# Patient Record
Sex: Female | Born: 1955 | Race: Black or African American | Hispanic: No | Smoking: Never smoker
Health system: Southern US, Community
[De-identification: ages and names within clinical notes are randomized; demographics above are authoritative.]

## PROBLEM LIST (undated history)

## (undated) DIAGNOSIS — I10 Essential (primary) hypertension: Secondary | ICD-10-CM

## (undated) DIAGNOSIS — E0500 Thyrotoxicosis with diffuse goiter without thyrotoxic crisis or storm: Secondary | ICD-10-CM

## (undated) DIAGNOSIS — H15009 Unspecified scleritis, unspecified eye: Secondary | ICD-10-CM

## (undated) HISTORY — PX: ABDOMINAL HYSTERECTOMY: SHX81

---

## 2020-07-11 ENCOUNTER — Ambulatory Visit (HOSPITAL_COMMUNITY)
Admission: EM | Admit: 2020-07-11 | Discharge: 2020-07-11 | Disposition: A | Discharge status: Home / Self Care | Payer: BC Managed Care – PPO

## 2020-07-11 ENCOUNTER — Other Ambulatory Visit: Payer: Self-pay

## 2020-07-11 ENCOUNTER — Encounter (HOSPITAL_COMMUNITY): Payer: Self-pay

## 2020-07-11 DIAGNOSIS — H579 Unspecified disorder of eye and adnexa: Secondary | ICD-10-CM

## 2020-07-11 DIAGNOSIS — H15002 Unspecified scleritis, left eye: Secondary | ICD-10-CM

## 2020-07-11 HISTORY — DX: Essential (primary) hypertension: I10

## 2020-07-11 HISTORY — DX: Unspecified scleritis, unspecified eye: H15.009

## 2020-07-11 HISTORY — DX: Thyrotoxicosis with diffuse goiter without thyrotoxic crisis or storm: E05.00

## 2020-07-11 MED ORDER — FLUORESCEIN SODIUM 1 MG OP STRP
ORAL_STRIP | OPHTHALMIC | Status: AC
  Filled 2020-07-11: qty 1

## 2020-07-11 MED ORDER — TETRACAINE HCL 0.5 % OP SOLN
OPHTHALMIC | Status: AC
  Filled 2020-07-11: qty 4

## 2020-07-11 MED ORDER — HYDROCODONE-ACETAMINOPHEN 5-325 MG PO TABS
1.0000 | ORAL_TABLET | Freq: Four times a day (QID) | ORAL | 0 refills | Status: DC | PRN
Start: 2020-07-11 — End: 2020-09-26

## 2020-07-11 NOTE — Discharge Instructions (Addendum)
Call ophthalmology tomorrow morning for follow up Hold off on eye drops tonight/tomorrow, continue oral prednisone Hydrocodone for pain in the interim If any symptoms worsening, developing vision changes follow up in the emergency room

## 2020-07-11 NOTE — ED Triage Notes (Signed)
Pt reports feeling pressure and redness  in the left eye for the past 3 days. States she is being treat it for scleritis with oral and eye drops prednisone for the past 3 months. Pt is worry as she was told by the Doctor, she can have high eye pressure as a side effect of the prednisone.

## 2020-07-12 NOTE — ED Provider Notes (Signed)
[facility]    CSN: [id number] Arrival date & time: 07/11/20  1839      History   Chief Complaint Chief Complaint  Patient presents with  . Eye Problem    HPI Donna Chandler is a 64 y.o. female history hypertension, Graves' disease, scleritis, presenting today for evaluation of left eye pressure and headache.  Patient has been treated for scleritis in her left eye over the past 5 months.  She initially was on prednisolone drops along with oral prednisone 40 mg, developed ulcer from drops and were stopped temporarily, ulcer healed and was restarted on prednisolone drops which she has been taking over the past month.  Her oral prednisone has been tapered down to 40 mg.  The appearance of her eyes she feels has improved and has become less erythematous.  Over the past 3 days she has developed increased pains in her eye with a pressure sensation behind her eye which radiates into her left temporal area.  She denies vision changes.  Denies any photophobia.  Denies any clouding or blurring of vision.  Ophthalmologist is in [place], recently moved to [place]. Reports her normal pressure is 13-14.  Denies any drainage, discharge.  Denies itching.  Reports blood pressure has been elevated above normal as well.  HPI  Past Medical History:  Diagnosis Date  . Graves disease   . Hypertension   . Scleritis     There are no problems to display for this patient.   Past Surgical History:  Procedure Laterality Date  . ABDOMINAL HYSTERECTOMY      OB History   No obstetric history on file.      Home Medications    Prior to Admission medications   Medication Sig Start Date End Date Taking? Authorizing Provider  amLODipine (NORVASC) 10 MG tablet amlodipine 10 mg tablet 01/28/15  Yes [provider]  benazepril (LOTENSIN) 10 MG tablet Take by mouth. 05/22/20   [provider]  HYDROcodone-acetaminophen (NORCO/VICODIN) 5-325 MG tablet Take 1-2 tablets by  mouth every 6 (six) hours as needed. 07/11/20   Windsor Zirkelbach C, PA-C  prednisoLONE acetate (PRED FORTE) 1 % ophthalmic suspension Place 1 drop into the left eye 2 (two) times daily. 06/07/20   [provider]  predniSONE (DELTASONE) 20 MG tablet prednisone 20 mg tablet    [provider]  SYNTHROID 112 MCG tablet Take 112 mcg by mouth daily. 05/15/20   [provider]    Family History History reviewed. No pertinent family history.  Social History Social History   Tobacco Use  . Smoking status: Never Smoker  . Smokeless tobacco: Never Used  Substance Use Topics  . Alcohol use: Never  . Drug use: Never     Allergies   Morphine, Cephalexin, and Penicillins   Review of Systems Review of Systems  Constitutional: Negative for activity change, appetite change, chills, fatigue and fever.  HENT: Negative for congestion, ear pain, rhinorrhea, sinus pressure, sore throat and trouble swallowing.   Eyes: Positive for pain. Negative for photophobia, discharge, redness and visual disturbance.  Respiratory: Negative for cough, chest tightness and shortness of breath.   Cardiovascular: Negative for chest pain.  Gastrointestinal: Negative for abdominal pain, diarrhea, nausea and vomiting.  Musculoskeletal: Negative for myalgias.  Skin: Negative for rash.  Neurological: Negative for dizziness, light-headedness and headaches.     Physical Exam Triage Vital Signs ED Triage Vitals  Enc Vitals Group     BP 07/11/20 1924 (!) 163/98  Pulse Rate 07/11/20 1924 85     Resp 07/11/20 1924 18     Temp 07/11/20 1924 98.2 F (36.8 C)     Temp Source 07/11/20 1924 Oral     SpO2 07/11/20 1924 98 %     Weight --      Height --      Head Circumference --      Peak Flow --      Pain Score 07/11/20 2011 7     Pain Loc --      Pain Edu? --      Excl. in GC? --    No data found.  Updated Vital Signs BP (!) 163/98 (BP Location: Right Arm)   Pulse 85   Temp 98.2 F  (36.8 C) (Oral)   Resp 18   SpO2 98%   Visual Acuity Right Eye Distance:   Left Eye Distance:   Bilateral Distance:    Right Eye Near:   Left Eye Near:    Bilateral Near:     Physical Exam Vitals and nursing note reviewed.  Constitutional:      Appearance: She is well-developed.     Comments: No acute distress  HENT:     Head: Normocephalic and atraumatic.     Nose: Nose normal.  Eyes:     Extraocular Movements: Extraocular movements intact.     Conjunctiva/sclera: Conjunctivae normal.     Pupils: Pupils are equal, round, and reactive to light.     Comments: Left eye: Anterior chamber clear; no photophobia with exam, red reflex present, pressure noted to be approximately 20 on the left with Tono-Pen  Cardiovascular:     Rate and Rhythm: Normal rate.  Pulmonary:     Effort: Pulmonary effort is normal. No respiratory distress.  Abdominal:     General: There is no distension.  Musculoskeletal:        General: Normal range of motion.     Cervical back: Neck supple.  Skin:    General: Skin is warm and dry.  Neurological:     Mental Status: She is alert and oriented to person, place, and time.      UC Treatments / Results  Labs (all labs ordered are listed, but only abnormal results are displayed) Labs Reviewed - No data to display  EKG   Radiology No results found.  Procedures Procedures (including critical care time)  Medications Ordered in UC Medications - No data to display  Initial Impression / Assessment and Plan / UC Course  I have reviewed the triage vital signs and the nursing notes.  Pertinent labs & imaging results that were available during my care of the patient were reviewed by me and considered in my medical decision making (see chart for details).     Pressure mildly elevated from normal, but have lower suspicion of acute angle glaucoma, discussed with patient need for further dilated eye exam to further evaluate vasculature and optic disc  MRI, provided contact locally for ophthalmology follow-up.  Recommending symptomatic and supportive care and holding off on prednisolone drops in the interim.  Discussed strict return precautions. Patient verbalized understanding and is agreeable with plan.  Final Clinical Impressions(s) / UC Diagnoses   Final diagnoses:  Eye pressure  Scleritis of left eye     Discharge Instructions     Call ophthalmology tomorrow morning for follow up Hold off on eye drops tonight/tomorrow, continue oral prednisone Hydrocodone for pain in the interim If any symptoms worsening, developing  vision changes follow up in the emergency room    ED Prescriptions    Medication Sig Dispense Auth. Provider   HYDROcodone-acetaminophen (NORCO/VICODIN) 5-325 MG tablet Take 1-2 tablets by mouth every 6 (six) hours as needed. 6 tablet Celine Dishman, [place] C, PA-C     I have reviewed the PDMP during this encounter.   Lew Dawes, PA-C 07/12/20 1211

## 2020-08-08 ENCOUNTER — Encounter (HOSPITAL_COMMUNITY): Payer: Self-pay | Admitting: Emergency Medicine

## 2020-08-08 ENCOUNTER — Ambulatory Visit (HOSPITAL_COMMUNITY)
Admission: EM | Admit: 2020-08-08 | Discharge: 2020-08-08 | Disposition: A | Discharge status: Home / Self Care | Payer: BC Managed Care – PPO | Attending: Family Medicine | Admitting: Family Medicine

## 2020-08-08 ENCOUNTER — Other Ambulatory Visit: Payer: Self-pay

## 2020-08-08 DIAGNOSIS — I10 Essential (primary) hypertension: Secondary | ICD-10-CM | POA: Diagnosis not present

## 2020-08-08 DIAGNOSIS — R519 Headache, unspecified: Secondary | ICD-10-CM

## 2020-08-08 DIAGNOSIS — R112 Nausea with vomiting, unspecified: Secondary | ICD-10-CM

## 2020-08-08 MED ORDER — ONDANSETRON 4 MG PO TBDP
ORAL_TABLET | ORAL | Status: AC
  Filled 2020-08-08: qty 1

## 2020-08-08 MED ORDER — ONDANSETRON 4 MG PO TBDP
4.0000 mg | ORAL_TABLET | Freq: Once | ORAL | Status: AC
  Administered 2020-08-08: 4 mg via ORAL

## 2020-08-08 MED ORDER — ONDANSETRON 4 MG PO TBDP: 4.0000 mg | ORAL_TABLET | Freq: Three times a day (TID) | ORAL | 0 refills | Status: DC | PRN

## 2020-08-08 NOTE — Discharge Instructions (Addendum)
Your blood pressure was noted to be elevated during your visit today. If you are currently taking medication for high blood pressure, please ensure you are taking this as directed. If you do not have a history of high blood pressure and your blood pressure remains persistently elevated, you may need to begin taking a medication at some point. You may return here within the next few days to recheck if unable to see your primary care provider or if do not have a one.  BP (!) 157/95 (BP Location: Left Arm)   Pulse 76   Temp (!) 97.3 F (36.3 C) (Oral)   Resp 20   SpO2 96%  Recheck BP 144/83  [facility] [address] [place], [place] [address] [phone]

## 2020-08-08 NOTE — ED Triage Notes (Addendum)
Pt presents with high blood pressure and nausea, dizziness and headache. States has been taking prednisone for 4 months and her BP continues to run high while taking it. Took prednisone last this am.   Took 30 mg of benazepril and 10 mg of amlodipine around 40 minutes ago to try to bring BP down.   Informed B Hagler, MD of pts condition, okay for pt to wait in lobby to be seen at [facility].

## 2020-08-13 NOTE — ED Provider Notes (Signed)
[facility]   [id number] 08/08/20 Arrival Time: 1440  ASSESSMENT & PLAN:  1. Elevated blood pressure reading in office with diagnosis of hypertension   2. Nausea and vomiting, intractability of vomiting not specified, unspecified vomiting type   3. Nonintractable headache, unspecified chronicity pattern, unspecified headache type     Meds ordered this encounter  Medications  . ondansetron (ZOFRAN-ODT) disintegrating tablet 4 mg  . ondansetron (ZOFRAN-ODT) 4 MG disintegrating tablet    Sig: Take 1 tablet (4 mg total) by mouth every 8 (eight) hours as needed for nausea or vomiting.    Dispense:  15 tablet    Refill:  0   Feeling better after Zofran. Recheck BP 144/83. No indication for ED admission at this time. She is comfortable with home observation. Will schedule prompt PCP f/u.   Discharge Instructions     Your blood pressure was noted to be elevated during your visit today. If you are currently taking medication for high blood pressure, please ensure you are taking this as directed. If you do not have a history of high blood pressure and your blood pressure remains persistently elevated, you may need to begin taking a medication at some point. You may return here within the next few days to recheck if unable to see your primary care provider or if do not have a one.  BP (!) 157/95 (BP Location: Left Arm)   Pulse 76   Temp (!) 97.3 F (36.3 C) (Oral)   Resp 20   SpO2 96%  Recheck BP 144/83  [facility] [address] [place], [place] [address] [phone]       Follow-up Information    [facility] EMERGENCY DEPARTMENT.   Specialty: Emergency Medicine Why: If symptoms worsen in any way. Contact information: [address] [id number] mc [place] [address] [phone]              Reviewed expectations re: course of current medical issues. Questions answered. Outlined signs and symptoms  indicating need for more acute intervention. Patient verbalized understanding. After Visit Summary given.   SUBJECTIVE:  Donna Chandler is a 64 y.o. female who presents with concerns regarding increased blood pressures. She is treated for HTN. She reports taking medications as instructed, no medication side effects noted, no chest pain on exertion, no dyspnea on exertion, no swelling of ankles, no orthopnea or paroxysmal nocturnal dyspnea and no palpitations. Does report intermittent lightheaded feeling and mild headache over the past several days; questions if related to prednisone taper she is to finish today; seems symptoms started when she began prednisone. Took benazepril and amlodipine within the hour. Not the worst headache of her life. Ambulatory without difficulty.   Social History   Tobacco Use  Smoking Status Never Smoker  Smokeless Tobacco Never Used   I.   OBJECTIVE:  Vitals:   08/08/20 1449  BP: (!) 157/95  Pulse: 76  Resp: 20  Temp: (!) 97.3 F (36.3 C)  TempSrc: Oral  SpO2: 96%    General appearance: alert; no distress Eyes: PERRLA; EOMI HENT: normocephalic; atraumatic Neck: supple Lungs: clear to auscultation bilaterally Heart: regular rate and rhythm without murmer Abd: benign Extremities: no edema; symmetrical with no gross deformities Skin: warm and dry Psychological: alert and cooperative; normal mood and affect  Imaging: Allergies  Allergen Reactions  . Morphine     Other reaction(s): Other (See Comments) Other Reaction: OTHER REACTION   . Cephalexin   . Penicillins  Other reaction(s): Unknown    Past Medical History:  Diagnosis Date  . Graves disease   . Hypertension   . Scleritis    Social History   Socioeconomic History  . Marital status: Divorced    Spouse name: Not on file  . Number of children: Not on file  . Years of education: Not on file  . Highest education level: Not on file  Occupational History  . Not on  file  Tobacco Use  . Smoking status: Never Smoker  . Smokeless tobacco: Never Used  Substance and Sexual Activity  . Alcohol use: Never  . Drug use: Never  . Sexual activity: Not on file  Other Topics Concern  . Not on file  Social History Narrative  . Not on file   Social Determinants of Health   Financial Resource Strain:   . Difficulty of Paying Living Expenses: Not on file  Food Insecurity:   . Worried About Programme researcher, broadcasting/film/video in the Last Year: Not on file  . Ran Out of Food in the Last Year: Not on file  Transportation Needs:   . Lack of Transportation (Medical): Not on file  . Lack of Transportation (Non-Medical): Not on file  Physical Activity:   . Days of Exercise per Week: Not on file  . Minutes of Exercise per Session: Not on file  Stress:   . Feeling of Stress : Not on file  Social Connections:   . Frequency of Communication with Friends and Family: Not on file  . Frequency of Social Gatherings with Friends and Family: Not on file  . Attends Religious Services: Not on file  . Active Member of Clubs or Organizations: Not on file  . Attends Banker Meetings: Not on file  . Marital Status: Not on file  Intimate Partner Violence:   . Fear of Current or Ex-Partner: Not on file  . Emotionally Abused: Not on file  . Physically Abused: Not on file  . Sexually Abused: Not on file   History reviewed. No pertinent family history. Past Surgical History:  Procedure Laterality Date  . ABDOMINAL HYSTERECTOMY        Mardella Layman, MD 08/13/20 [phone]

## 2020-09-26 ENCOUNTER — Encounter (HOSPITAL_COMMUNITY): Payer: Self-pay | Admitting: Emergency Medicine

## 2020-09-26 ENCOUNTER — Other Ambulatory Visit: Payer: Self-pay

## 2020-09-26 ENCOUNTER — Ambulatory Visit (HOSPITAL_COMMUNITY)
Admission: EM | Admit: 2020-09-26 | Discharge: 2020-09-26 | Disposition: A | Discharge status: Home / Self Care | Payer: BC Managed Care – PPO | Attending: Family Medicine | Admitting: Family Medicine

## 2020-09-26 DIAGNOSIS — I10 Essential (primary) hypertension: Secondary | ICD-10-CM | POA: Diagnosis not present

## 2020-09-26 MED ORDER — HYDROCHLOROTHIAZIDE 25 MG PO TABS: 25.0000 mg | ORAL_TABLET | Freq: Every day | ORAL | 0 refills | Status: DC

## 2020-09-26 NOTE — Discharge Instructions (Signed)
Follow up Feb 10th with Primary Care

## 2020-09-26 NOTE — ED Provider Notes (Signed)
[facility]    CSN: [id number] Arrival date & time: 09/26/20  1842      History   Chief Complaint Chief Complaint  Patient presents with  . Headache  . Hypertension    HPI Donna Chandler is a 65 y.o. female.   Here today concerned about persistently elevated BP in the range of 150-160/90s per home BP cuff and headaches. The headaches are mild and always associated with high BP readings. Was on benazepril and amlodipine but started having itching with these about a month ago so has stopped them. Cannot get into PCP until Feb 10th and wanting to discuss a change before then. Denies worst headache of life, dizziness, visual changes, CP, SOB.      Past Medical History:  Diagnosis Date  . Graves disease   . Hypertension   . Scleritis     There are no problems to display for this patient.   Past Surgical History:  Procedure Laterality Date  . ABDOMINAL HYSTERECTOMY      OB History   No obstetric history on file.      Home Medications    Prior to Admission medications   Medication Sig Start Date End Date Taking? Authorizing Provider  SYNTHROID 112 MCG tablet Take 112 mcg by mouth daily. 05/15/20  Yes [provider]  hydrochlorothiazide (HYDRODIURIL) 25 MG tablet Take 1 tablet (25 mg total) by mouth daily. 09/26/20   Particia Nearing, PA-C  amLODipine (NORVASC) 10 MG tablet amlodipine 10 mg tablet 01/28/15 09/26/20  [provider]  benazepril (LOTENSIN) 10 MG tablet Take by mouth. 05/22/20 09/26/20  [provider]    Family History History reviewed. No pertinent family history.  Social History Social History   Tobacco Use  . Smoking status: Never Smoker  . Smokeless tobacco: Never Used  Substance Use Topics  . Alcohol use: Never  . Drug use: Never     Allergies   Morphine, Amlodipine, Benazepril, Cephalexin, and Penicillins   Review of Systems Review of Systems PER HPI   Physical Exam Triage Vital  Signs ED Triage Vitals  Enc Vitals Group     BP 09/26/20 1958 (!) 160/82     Pulse Rate 09/26/20 1958 81     Resp 09/26/20 1958 16     Temp 09/26/20 1958 98.4 F (36.9 C)     Temp Source 09/26/20 1958 Oral     SpO2 09/26/20 1958 98 %     Weight 09/26/20 1955 235 lb (106.6 kg)     Height 09/26/20 1955 5\' 8"  (1.727 m)     Head Circumference --      Peak Flow --      Pain Score 09/26/20 1954 8     Pain Loc --      Pain Edu? --      Excl. in GC? --    No data found.  Updated Vital Signs BP (!) 160/82 (BP Location: Right Arm)   Pulse 81   Temp 98.4 F (36.9 C) (Oral)   Resp 16   Ht 5\' 8"  (1.727 m)   Wt 235 lb (106.6 kg)   SpO2 98%   BMI 35.73 kg/m   Visual Acuity Right Eye Distance:   Left Eye Distance:   Bilateral Distance:    Right Eye Near:   Left Eye Near:    Bilateral Near:     Physical Exam Vitals and nursing note reviewed.  Constitutional:      Appearance: Normal appearance.  She is not ill-appearing.  HENT:     Head: Atraumatic.  Eyes:     Extraocular Movements: Extraocular movements intact.     Conjunctiva/sclera: Conjunctivae normal.     Pupils: Pupils are equal, round, and reactive to light.  Cardiovascular:     Rate and Rhythm: Normal rate and regular rhythm.     Heart sounds: Normal heart sounds.  Pulmonary:     Effort: Pulmonary effort is normal.     Breath sounds: Normal breath sounds.  Musculoskeletal:        General: Normal range of motion.     Cervical back: Normal range of motion and neck supple.  Skin:    General: Skin is warm and dry.  Neurological:     General: No focal deficit present.     Mental Status: She is alert and oriented to person, place, and time.     Cranial Nerves: No cranial nerve deficit.     Motor: No weakness.     Gait: Gait normal.  Psychiatric:        Mood and Affect: Mood normal.        Thought Content: Thought content normal.        Judgment: Judgment normal.      UC Treatments / Results  Labs (all  labs ordered are listed, but only abnormal results are displayed) Labs Reviewed - No data to display  EKG   Radiology No results found.  Procedures Procedures (including critical care time)  Medications Ordered in UC Medications - No data to display  Initial Impression / Assessment and Plan / UC Course  I have reviewed the triage vital signs and the nursing notes.  Pertinent labs & imaging results that were available during my care of the patient were reviewed by me and considered in my medical decision making (see chart for details).     Exam and vitals aside from elevated BP reassuring, will trial HCTZ, monitor home readings closely. Discussed DASH diet, return precautions, and close PCP f/u as scheduled for recheck.   Final Clinical Impressions(s) / UC Diagnoses   Final diagnoses:  Essential hypertension     Discharge Instructions     Follow up Feb 10th with Primary Care     ED Prescriptions    Medication Sig Dispense Auth. Provider   hydrochlorothiazide (HYDRODIURIL) 25 MG tablet  (Status: Discontinued) Take 1 tablet (25 mg total) by mouth daily. 30 tablet Particia Nearing, [place]   hydrochlorothiazide (HYDRODIURIL) 25 MG tablet Take 1 tablet (25 mg total) by mouth daily. 30 tablet Particia Nearing, [place]     PDMP not reviewed this encounter.   Particia Nearing, [place] 09/26/20 2030

## 2020-09-26 NOTE — ED Triage Notes (Addendum)
Patient c/o headache and HTN x 1 month.   Patient reports a BP of 168/101 at home.   Patient endorses having an episode last month while taking Prednisone when BP elevated.   Patient denies LOC, Chest pain, and SOB.   Patient endorses that she stopped taking amlodipine and benazepril due to itchiness.

## 2020-10-10 ENCOUNTER — Other Ambulatory Visit: Payer: Self-pay | Admitting: Family Medicine

## 2020-10-18 ENCOUNTER — Ambulatory Visit: Payer: BC Managed Care – PPO | Admitting: Family Medicine

## 2020-10-18 ENCOUNTER — Encounter: Payer: Self-pay | Admitting: Family Medicine

## 2020-10-18 ENCOUNTER — Other Ambulatory Visit: Payer: Self-pay

## 2020-10-18 ENCOUNTER — Other Ambulatory Visit: Payer: Self-pay | Admitting: Family Medicine

## 2020-10-18 VITALS — BP 126/82 | HR 83 | Temp 98.7°F | Ht 68.0 in | Wt 237.8 lb

## 2020-10-18 DIAGNOSIS — E039 Hypothyroidism, unspecified: Secondary | ICD-10-CM

## 2020-10-18 DIAGNOSIS — R252 Cramp and spasm: Secondary | ICD-10-CM

## 2020-10-18 DIAGNOSIS — E559 Vitamin D deficiency, unspecified: Secondary | ICD-10-CM

## 2020-10-18 DIAGNOSIS — G43809 Other migraine, not intractable, without status migrainosus: Secondary | ICD-10-CM

## 2020-10-18 DIAGNOSIS — I10 Essential (primary) hypertension: Secondary | ICD-10-CM | POA: Diagnosis not present

## 2020-10-18 DIAGNOSIS — G43909 Migraine, unspecified, not intractable, without status migrainosus: Secondary | ICD-10-CM | POA: Insufficient documentation

## 2020-10-18 DIAGNOSIS — K635 Polyp of colon: Secondary | ICD-10-CM

## 2020-10-18 LAB — LIPID PANEL
Cholesterol: 285 mg/dL — ABNORMAL HIGH (ref 0–200)
HDL: 39.1 mg/dL (ref >39.00)
LDL Cholesterol: 212 mg/dL — ABNORMAL HIGH (ref 0–99)
NonHDL: 245.43
Total CHOL/HDL Ratio: 7
Triglycerides: 165 mg/dL — ABNORMAL HIGH (ref 0.0–149.0)
VLDL: 33 mg/dL (ref 0.0–40.0)

## 2020-10-18 LAB — MAGNESIUM: Magnesium: 2.2 mg/dL (ref 1.5–2.5)

## 2020-10-18 LAB — COMPREHENSIVE METABOLIC PANEL
ALT: 18 U/L (ref 0–35)
AST: 18 U/L (ref 0–37)
Albumin: 4.6 g/dL (ref 3.5–5.2)
Alkaline Phosphatase: 84 U/L (ref 39–117)
BUN: 12 mg/dL (ref 6–23)
CO2: 26 mEq/L (ref 19–32)
Calcium: 9.9 mg/dL (ref 8.4–10.5)
Chloride: 102 mEq/L (ref 96–112)
Creatinine, Ser: 0.93 mg/dL (ref 0.40–1.20)
GFR: 65.22 mL/min (ref >60.00)
Glucose, Bld: 99 mg/dL (ref 70–99)
Potassium: 4 mEq/L (ref 3.5–5.1)
Sodium: 137 mEq/L (ref 135–145)
Total Bilirubin: 0.3 mg/dL (ref 0.2–1.2)
Total Protein: 7.9 g/dL (ref 6.0–8.3)

## 2020-10-18 LAB — TSH: TSH: 7.88 u[IU]/mL — ABNORMAL HIGH (ref 0.35–4.50)

## 2020-10-18 LAB — VITAMIN D 25 HYDROXY (VIT D DEFICIENCY, FRACTURES): VITD: 73.95 ng/mL (ref 30.00–100.00)

## 2020-10-18 MED ORDER — DOXYCYCLINE HYCLATE 100 MG PO TABS: 100.0000 mg | ORAL_TABLET | Freq: Two times a day (BID) | ORAL | 0 refills | Status: DC

## 2020-10-18 NOTE — Progress Notes (Signed)
   Donna Chandler is a 65 y.o. female who presents today for an office visit.  She is a new patient.   Assessment/Plan:  New/Acute Problems: Leg Cramp / Involuntary Movement Interestingly has improved with magnesium supplementation.  Will check magnesium level today.  Reassuring exam today.  If symptoms persist would consider referral to neurology.  Chronic Problems Addressed Today: Vitamin D deficiency Check vitamin D level today.  Colon polyps Will place referral to GI for colon cancer screening.  Last colonoscopy was last year.  She has been getting it done every 2 years.  Hypothyroidism We will check TSH today.  Continue Synthroid 112 mcg daily.  Essential hypertension At goal today.  She is on amlodipine 10 mg daily and tolerating well.  Will check labs.  Will avoid benazepril another ACE inhibitor's in the future due to her drug reaction.  Migraines Stable.  Continue Fioricet as needed.  Preventative Healthcare Will obtain records from previous PCP.  Follows with GYN for Pap and mammogram.  Will place referral to GI for colonoscopy.  Check labs today.    Subjective:  HPI:  Patient is here as a new patient.  She recently moved to the area after spending a few months in [place] and then was in during previous to this.  See A/P for status of chronic conditions.  She recently has been dealing with episcleritis likely secondary to trauma she suffered on left eye.  She was given prednisone to help with this which caused her blood pressure to become elevated.  She was started on benazepril which then she had an allergic reaction to.  She has followed with dermatology.  She is currently using triamcinolone for her skin reaction.  Symptoms seem to be improving.  Ever since she started prednisone she has had intermittent issues with left leg cramping and occasionally involuntary movements in the left toes.  She has started taking magnesium which seems to help.  No other weakness or  numbness.       Objective:  Physical Exam: BP 126/82   Pulse 83   Temp 98.7 F (37.1 C) (Temporal)   Ht 5\' 8"  (1.727 m)   Wt 237 lb 12.8 oz (107.9 kg)   SpO2 98%   BMI 36.16 kg/m   Gen: No acute distress, resting comfortably CV: Regular rate and rhythm with no murmurs appreciated Pulm: Normal work of breathing, clear to auscultation bilaterally with no crackles, wheezes, or rhonchi MSK: No abnormalities.  Full range of motion throughout.  Neurovascular intact distally Neuro: Grossly normal, moves all extremities Psych: Normal affect and thought content      Issaac Shipper M. , MD 10/18/2020 2:27 PM

## 2020-10-18 NOTE — Assessment & Plan Note (Signed)
At goal today.  She is on amlodipine 10 mg daily and tolerating well.  Will check labs.  Will avoid benazepril another ACE inhibitor's in the future due to her drug reaction.

## 2020-10-18 NOTE — Assessment & Plan Note (Signed)
Will place referral to GI for colon cancer screening.  Last colonoscopy was last year.  She has been getting it done every 2 years.

## 2020-10-18 NOTE — Assessment & Plan Note (Signed)
We will check TSH today.  Continue Synthroid 112 mcg daily.

## 2020-10-18 NOTE — Patient Instructions (Signed)
It was very nice to see you today!  We will check blood work today.  Please take the doxycycline.  Let me know if not improving.  I will place a referral for your colon cancer screening.  We may need to send you to a neurologist finding on the results your blood work.  I will see you back in a year.  Please come back to see me sooner as needed.  Take care, Dr Jimmey Ralph  Please try these tips to maintain a healthy lifestyle:   Eat at least 3 REAL meals and 1-2 snacks per day.  Aim for no more than 5 hours between eating.  If you eat breakfast, please do so within one hour of getting up.    Each meal should contain half fruits/vegetables, one quarter protein, and one quarter carbs (no bigger than a computer mouse)   Cut down on sweet beverages. This includes juice, soda, and sweet tea.     Drink at least 1 glass of water with each meal and aim for at least 8 glasses per day   Exercise at least 150 minutes every week.

## 2020-10-18 NOTE — Assessment & Plan Note (Signed)
Check vitamin D level today 

## 2020-10-18 NOTE — Assessment & Plan Note (Signed)
Stable.  Continue Fioricet as needed.

## 2020-10-19 ENCOUNTER — Encounter: Payer: Self-pay | Admitting: Family Medicine

## 2020-10-19 DIAGNOSIS — E785 Hyperlipidemia, unspecified: Secondary | ICD-10-CM | POA: Insufficient documentation

## 2020-10-19 NOTE — Progress Notes (Signed)
Please schedule pt for TSH recheck in 1-2 weeks.  Thank You

## 2020-10-19 NOTE — Progress Notes (Signed)
Please inform patient of the following:  Her thyroid level is off. This could explain some of her symptoms. Would like for her to come back in a week or so to recheck TSH to confirm before we change her medication.   Her cholesterol is elevated. Would like for her to keep working on high fiber diet and regular exercise. I hope that these numbers will improve as we treat her thyroid but she may benefit from starting a cholesterol medication if she is interested. Please send in lipitor 40mg  daily if she is willing to start.   Her magnesium and all of her other labs are NORMAL. I would like to make sure we fix her thyroid levels first before we look into other causes of her cramping and toe movement.  . Katina Degree, MD 10/19/2020 9:12 AM

## 2020-10-23 ENCOUNTER — Other Ambulatory Visit: Payer: Self-pay | Admitting: *Deleted

## 2020-10-23 MED ORDER — ATORVASTATIN CALCIUM 40 MG PO TABS: 40.0000 mg | ORAL_TABLET | Freq: Every day | ORAL | 1 refills | Status: DC

## 2020-11-30 ENCOUNTER — Encounter: Payer: Self-pay | Admitting: Family Medicine

## 2020-11-30 ENCOUNTER — Ambulatory Visit: Payer: BC Managed Care – PPO | Admitting: Family Medicine

## 2020-11-30 ENCOUNTER — Other Ambulatory Visit: Payer: Self-pay

## 2020-11-30 VITALS — BP 131/85 | HR 76 | Temp 97.6°F | Ht 68.0 in | Wt 243.8 lb

## 2020-11-30 DIAGNOSIS — E785 Hyperlipidemia, unspecified: Secondary | ICD-10-CM | POA: Diagnosis not present

## 2020-11-30 DIAGNOSIS — G500 Trigeminal neuralgia: Secondary | ICD-10-CM | POA: Insufficient documentation

## 2020-11-30 DIAGNOSIS — G43809 Other migraine, not intractable, without status migrainosus: Secondary | ICD-10-CM

## 2020-11-30 DIAGNOSIS — R252 Cramp and spasm: Secondary | ICD-10-CM

## 2020-11-30 DIAGNOSIS — I10 Essential (primary) hypertension: Secondary | ICD-10-CM

## 2020-11-30 DIAGNOSIS — E039 Hypothyroidism, unspecified: Secondary | ICD-10-CM

## 2020-11-30 DIAGNOSIS — R519 Headache, unspecified: Secondary | ICD-10-CM | POA: Insufficient documentation

## 2020-11-30 LAB — TSH: TSH: 10 u[IU]/mL — ABNORMAL HIGH (ref 0.35–4.50)

## 2020-11-30 LAB — VITAMIN B12: Vitamin B-12: 525 pg/mL (ref 211–911)

## 2020-11-30 MED ORDER — LOSARTAN POTASSIUM 50 MG PO TABS: 50.0000 mg | ORAL_TABLET | Freq: Every day | ORAL | 3 refills | Status: DC

## 2020-11-30 NOTE — Assessment & Plan Note (Signed)
Stable.  Continue Fioricet as needed.

## 2020-11-30 NOTE — Progress Notes (Signed)
   Donna Chandler is a 65 y.o. female who presents today for an office visit.  Assessment/Plan:  Chronic Problems Addressed Today: Trigeminal neuralgia No red flags.  Her intermittent left foot pain consistent with mild trigeminal neuralgia.  Otherwise reassuring neuro exam today.  Given the mild nature of her symptoms we will continue with watchful waiting.  Discussed reasons to return to care.  Leg cramp Has improved with magnesium supplementation.  Recommend she continue this and continue staying well-hydrated.  Will check B12 level today.  Dyslipidemia Deferred statin.  Hypothyroidism Last TSH above goal.  She is on Synthroid 112 mcg daily.  Will check TSH today.  Essential hypertension At goal but she is unable to tolerate amlodipine due to side effects.  Will start losartan 50 mg daily.  Discussed home blood pressure monitoring goal 140/90 or lower.  Migraines Stable.  Continue Fioricet as needed.     Subjective:  HPI:  Patient here for follow-up.  Was seen about 6 weeks ago for new patient visit.  Was having some leg cramping at that time.  We checked labs which were essentially normal.  Symptoms have improved significantly with magnesium supplementation.  Her magnesium level last time was 2.2.  Fortunately she is also having allergic reaction to amlodipine including rash and itching.  She would like to stop this medication.  She also has intermittent pain in her left temple.  This happens several times per week.  Last for a few minutes and then subsides.  No weakness or numbness.  No vision changes.  No hearing changes.       Objective:  Physical Exam: BP 131/85   Pulse 76   Temp 97.6 F (36.4 C) (Temporal)   Ht 5\' 8"  (1.727 m)   Wt 243 lb 12.8 oz (110.6 kg)   SpO2 99%   BMI 37.07 kg/m   Gen: No acute distress, resting comfortably Neuro: Left temple nontender to palpation though does have decreased sensation compared to right.  Otherwise cranial nerves II  through XII intact.  Strength 5 out of 5 in upper and lower extremities. Psych: Normal affect and thought content      Donna Chandler M. , MD 11/30/2020 10:08 AM

## 2020-11-30 NOTE — Assessment & Plan Note (Signed)
Has improved with magnesium supplementation.  Recommend she continue this and continue staying well-hydrated.  Will check B12 level today.

## 2020-11-30 NOTE — Assessment & Plan Note (Signed)
Deferred statin.

## 2020-11-30 NOTE — Assessment & Plan Note (Signed)
Last TSH above goal.  She is on Synthroid 112 mcg daily.  Will check TSH today.

## 2020-11-30 NOTE — Assessment & Plan Note (Signed)
At goal but she is unable to tolerate amlodipine due to side effects.  Will start losartan 50 mg daily.  Discussed home blood pressure monitoring goal 140/90 or lower.

## 2020-11-30 NOTE — Patient Instructions (Signed)
It was very nice to see you today!  Please take losartan 50 mg daily for your blood pressure.  Keep an eye on it and let me know if it is persistently 140/90 or higher.  We will check your blood work today.  Please let me know if your headaches or cramps become more persistent or bothersome.  Take care, Dr Jimmey Ralph  PLEASE NOTE:  If you had any lab tests please let us know if you have not heard back within a few days. You may see your results on mychart before we have a chance to review them but we will give you a call once they are reviewed by [place]. If we ordered any referrals today, please let us know if you have not heard from their office within the next week.   Please try these tips to maintain a healthy lifestyle:   Eat at least 3 REAL meals and 1-2 snacks per day.  Aim for no more than 5 hours between eating.  If you eat breakfast, please do so within one hour of getting up.    Each meal should contain half fruits/vegetables, one quarter protein, and one quarter carbs (no bigger than a computer mouse)   Cut down on sweet beverages. This includes juice, soda, and sweet tea.     Drink at least 1 glass of water with each meal and aim for at least 8 glasses per day   Exercise at least 150 minutes every week.

## 2020-11-30 NOTE — Assessment & Plan Note (Signed)
No red flags.  Her intermittent left foot pain consistent with mild trigeminal neuralgia.  Otherwise reassuring neuro exam today.  Given the mild nature of her symptoms we will continue with watchful waiting.  Discussed reasons to return to care.

## 2020-11-30 NOTE — Progress Notes (Signed)
Please inform patient of the following:  B12 level is normal however her thyroid levels are not at goal.  Recommend we increase her Synthroid to daily and have her come back in 4 to 6 weeks to recheck.

## 2020-12-04 ENCOUNTER — Telehealth: Payer: Self-pay

## 2020-12-04 ENCOUNTER — Other Ambulatory Visit: Payer: Self-pay | Admitting: *Deleted

## 2020-12-04 MED ORDER — LEVOTHYROXINE SODIUM 125 MCG PO TABS: 125.0000 ug | ORAL_TABLET | Freq: Every day | ORAL | 0 refills | Status: DC

## 2020-12-04 NOTE — Telephone Encounter (Signed)
Patient called regarding lab results 

## 2020-12-04 NOTE — Telephone Encounter (Signed)
See result note.  

## 2020-12-20 ENCOUNTER — Ambulatory Visit
Admission: RE | Admit: 2020-12-20 | Discharge: 2020-12-20 | Disposition: A | Discharge status: Home / Self Care | Payer: BC Managed Care – PPO | Source: Ambulatory Visit | Attending: Ophthalmology | Admitting: Ophthalmology

## 2020-12-20 ENCOUNTER — Other Ambulatory Visit: Payer: Self-pay | Admitting: Ophthalmology

## 2020-12-20 DIAGNOSIS — H15102 Unspecified episcleritis, left eye: Secondary | ICD-10-CM

## 2020-12-25 ENCOUNTER — Other Ambulatory Visit: Payer: Self-pay | Admitting: Family Medicine

## 2020-12-26 ENCOUNTER — Ambulatory Visit: Payer: BC Managed Care – PPO | Admitting: Family Medicine

## 2021-01-03 ENCOUNTER — Other Ambulatory Visit: Payer: Self-pay

## 2021-01-03 NOTE — Telephone Encounter (Signed)
  LAST APPOINTMENT DATE: 11/30/2020  NEXT APPOINTMENT DATE:@Visit  date not found  MEDICATION:Butalbital-APAP-Caffeine 50-300-40 MG CAPS  PHARMACY:[id number] [place], [place] - [address]  Comments:Patient has only two pills left  Please advise

## 2021-01-04 MED ORDER — BUTALBITAL-APAP-CAFFEINE 50-300-40 MG PO CAPS: ORAL_CAPSULE | ORAL | 5 refills | Status: DC

## 2021-01-10 ENCOUNTER — Encounter (HOSPITAL_COMMUNITY): Payer: Self-pay

## 2021-01-10 ENCOUNTER — Emergency Department (HOSPITAL_COMMUNITY)
Admission: EM | Admit: 2021-01-10 | Discharge: 2021-01-10 | Disposition: A | Discharge status: Home / Self Care | Payer: BC Managed Care – PPO | Attending: Emergency Medicine | Admitting: Emergency Medicine

## 2021-01-10 ENCOUNTER — Emergency Department (HOSPITAL_COMMUNITY): Payer: BC Managed Care – PPO

## 2021-01-10 ENCOUNTER — Other Ambulatory Visit: Payer: Self-pay

## 2021-01-10 ENCOUNTER — Emergency Department (HOSPITAL_BASED_OUTPATIENT_CLINIC_OR_DEPARTMENT_OTHER): Payer: BC Managed Care – PPO

## 2021-01-10 DIAGNOSIS — E039 Hypothyroidism, unspecified: Secondary | ICD-10-CM | POA: Insufficient documentation

## 2021-01-10 DIAGNOSIS — M79605 Pain in left leg: Secondary | ICD-10-CM

## 2021-01-10 DIAGNOSIS — M7989 Other specified soft tissue disorders: Secondary | ICD-10-CM | POA: Diagnosis not present

## 2021-01-10 DIAGNOSIS — Z79899 Other long term (current) drug therapy: Secondary | ICD-10-CM | POA: Diagnosis not present

## 2021-01-10 DIAGNOSIS — R911 Solitary pulmonary nodule: Secondary | ICD-10-CM

## 2021-01-10 DIAGNOSIS — I10 Essential (primary) hypertension: Secondary | ICD-10-CM | POA: Diagnosis not present

## 2021-01-10 DIAGNOSIS — M79604 Pain in right leg: Secondary | ICD-10-CM

## 2021-01-10 LAB — CBC
HCT: 40 % (ref 36.0–46.0)
Hemoglobin: 13.1 g/dL (ref 12.0–15.0)
MCH: 29.4 pg (ref 26.0–34.0)
MCHC: 32.8 g/dL (ref 30.0–36.0)
MCV: 89.7 fL (ref 80.0–100.0)
Platelets: 263 10*3/uL (ref 150–400)
RBC: 4.46 MIL/uL (ref 3.87–5.11)
RDW: 14 % (ref 11.5–15.5)
WBC: 7.8 10*3/uL (ref 4.0–10.5)
nRBC: 0 % (ref 0.0–0.2)

## 2021-01-10 LAB — BASIC METABOLIC PANEL
Anion gap: 9 (ref 5–15)
BUN: 15 mg/dL (ref 8–23)
CO2: 25 mmol/L (ref 22–32)
Calcium: 9.2 mg/dL (ref 8.9–10.3)
Chloride: 101 mmol/L (ref 98–111)
Creatinine, Ser: 0.89 mg/dL (ref 0.44–1.00)
GFR, Estimated: >60 mL/min (ref >60)
Glucose, Bld: 144 mg/dL — ABNORMAL HIGH (ref 70–99)
Potassium: 3.9 mmol/L (ref 3.5–5.1)
Sodium: 135 mmol/L (ref 135–145)

## 2021-01-10 LAB — MAGNESIUM: Magnesium: 2.2 mg/dL (ref 1.7–2.4)

## 2021-01-10 MED ORDER — DIPHENHYDRAMINE HCL 25 MG PO CAPS
25.0000 mg | ORAL_CAPSULE | Freq: Once | ORAL | Status: AC
  Administered 2021-01-10: 25 mg via ORAL
  Filled 2021-01-10: qty 1

## 2021-01-10 MED ORDER — METHOCARBAMOL 500 MG PO TABS: 500.0000 mg | ORAL_TABLET | Freq: Two times a day (BID) | ORAL | 0 refills | Status: DC | PRN

## 2021-01-10 MED ORDER — IOHEXOL 350 MG/ML SOLN
100.0000 mL | Freq: Once | INTRAVENOUS | Status: AC | PRN
  Administered 2021-01-10: 65 mL via INTRAVENOUS

## 2021-01-10 MED ORDER — AMLODIPINE BESYLATE 5 MG PO TABS
10.0000 mg | ORAL_TABLET | Freq: Once | ORAL | Status: AC
  Administered 2021-01-10: 10 mg via ORAL
  Filled 2021-01-10: qty 2

## 2021-01-10 MED ORDER — METHOCARBAMOL 500 MG PO TABS
500.0000 mg | ORAL_TABLET | Freq: Once | ORAL | Status: AC
  Administered 2021-01-10: 500 mg via ORAL
  Filled 2021-01-10: qty 1

## 2021-01-10 NOTE — ED Provider Notes (Signed)
[facility] EMERGENCY DEPARTMENT Provider Note   CSN: [id number] Arrival date & time: 01/10/21  0034     History Chief Complaint  Patient presents with  . Leg Pain    Donna Chandler is a 65 y.o. female.  Patient with history of HTN, scleritis, Graves, presents for evaluation of L>R calf pain and swelling x 1 week. She states it feels like it has a constant cramp and has been swollen. It is the left leg that bothers her and reports the right leg has only minor symptoms. No redness. No injury. No history of blood clots, not on exogenous estrogen. She does state she went to [facility] x 2 a couple of weeks ago by car. She has had some SOB but reports this is ongoing since starting prednisone for her eye condition last year.   The history is provided by the patient. No language interpreter was used.  Leg Pain      Past Medical History:  Diagnosis Date  . Graves disease   . Hypertension   . Scleritis     Patient Active Problem List   Diagnosis Date Noted  . Leg cramp 11/30/2020  . Headache 11/30/2020  . Trigeminal neuralgia 11/30/2020  . Dyslipidemia 10/19/2020  . Migraines 10/18/2020  . Essential hypertension 10/18/2020  . Hypothyroidism 10/18/2020  . Colon polyps 10/18/2020  . Vitamin D deficiency 10/18/2020    Past Surgical History:  Procedure Laterality Date  . ABDOMINAL HYSTERECTOMY       OB History   No obstetric history on file.     Family History  Problem Relation Age of Onset  . Heart disease Mother   . Cancer Father   . Cancer Sister 18       colon cancer   . Cancer Brother 32       pancreatic cancer   . Cerebral aneurysm Son   . Heart disease Maternal Grandmother   . Cancer Maternal Grandfather   . Heart disease Paternal Grandmother     Social History   Tobacco Use  . Smoking status: Never Smoker  . Smokeless tobacco: Never Used  Substance Use Topics  . Alcohol use: Never  . Drug use: Never    Home  Medications Prior to Admission medications   Medication Sig Start Date End Date Taking? Authorizing Provider  Butalbital-APAP-Caffeine 50-300-40 MG CAPS Take 1-2 tablets as needed every 6 hours. Do not exceed 6 tablets in 24 hours. 01/04/21   Ardith Dark, MD  levothyroxine (SYNTHROID) 125 MCG tablet Take 1 tablet (125 mcg total) by mouth daily. 12/04/20   Ardith Dark, MD  losartan (COZAAR) 50 MG tablet Take 1 tablet (50 mg total) by mouth daily. 11/30/20   Ardith Dark, MD    Allergies    Morphine, Amlodipine, Benazepril, Penicillins, and Cephalexin  Review of Systems   Review of Systems  Physical Exam Updated Vital Signs BP (!) 166/101   Pulse 78   Temp (!) 97.5 F (36.4 C) (Oral)   Resp 19   Ht 5\' 8"  (1.727 m)   Wt 108.9 kg   SpO2 97%   BMI 36.49 kg/m   Physical Exam  ED Results / Procedures / Treatments   Labs (all labs ordered are listed, but only abnormal results are displayed) Labs Reviewed  BASIC METABOLIC PANEL - Abnormal; Notable for the following components:      Result Value   Glucose, Bld 144 (*)    All other components within normal  limits  MAGNESIUM  CBC   Results for orders placed or performed during the hospital encounter of 01/10/21  Basic metabolic panel  Result Value Ref Range   Sodium 135 135 - 145 mmol/L   Potassium 3.9 3.5 - 5.1 mmol/L   Chloride 101 98 - 111 mmol/L   CO2 25 22 - 32 mmol/L   Glucose, Bld 144 (H) 70 - 99 mg/dL   BUN 15 8 - 23 mg/dL   Creatinine, Ser 7.41 0.44 - 1.00 mg/dL   Calcium 9.2 8.9 - 28.7 mg/dL   GFR, Estimated >86 >76 mL/min   Anion gap 9 5 - 15  Magnesium  Result Value Ref Range   Magnesium 2.2 1.7 - 2.4 mg/dL  CBC  Result Value Ref Range   WBC 7.8 4.0 - 10.5 K/uL   RBC 4.46 3.87 - 5.11 MIL/uL   Hemoglobin 13.1 12.0 - 15.0 g/dL   HCT 72.0 94.7 - 09.6 %   MCV 89.7 80.0 - 100.0 fL   MCH 29.4 26.0 - 34.0 pg   MCHC 32.8 30.0 - 36.0 g/dL   RDW 28.3 66.2 - 94.7 %   Platelets 263 150 - 400 K/uL   nRBC  0.0 0.0 - 0.2 %    EKG None  Radiology No results found.  Procedures Procedures   Medications Ordered in ED Medications - No data to display  ED Course  I have reviewed the triage vital signs and the nursing notes.  Pertinent labs & imaging results that were available during my care of the patient were reviewed by me and considered in my medical decision making (see chart for details).    MDM Rules/Calculators/A&P                          Patient to ED with complaint of L>R lower leg "cramping" like pain as detailed in the HPI.   She is overall well appearing. There is mild swelling and tenderness of the left LE with +homen's. The right calf is minimally tender, without swelling. No redness. Will obtain LE doppler's in am for further evaluation given her risk of recent car travel of >4 hours duration.  CXR shows a persistent 13 mm modular density in th eright base, previously seen. CT chest recommended. The patient was aware of a nodule seen but that has been further evaluated as recommended. Given pending dopplers, will order CTA chest for further classification of the nodule as well as to r/o PE.   Patient care signed out to [facility], PA-C, pending completion of work up.   Final Clinical Impression(s) / ED Diagnoses Final diagnoses:  None   1. LE pain 2. Right lung nodule  Rx / DC Orders ED Discharge Orders    None       Elpidio Anis, Cordelia Poche 01/10/21 0701    Zadie Rhine, MD 01/10/21 [phone]

## 2021-01-10 NOTE — ED Notes (Signed)
Larger BP cuff placed on pt related to pt's arm size to ensure BP measurement accuracy

## 2021-01-10 NOTE — ED Notes (Signed)
IV team consult placed at this time. °

## 2021-01-10 NOTE — ED Triage Notes (Signed)
Bilateral leg pain (worse on the left) feels like a "charlie horse". Denies any fever and chills. Denies any swelling and trauma.

## 2021-01-10 NOTE — Progress Notes (Signed)
Bilateral lower extremity venous duplex completed. Refer to "CV Proc" under chart review to view preliminary results.  01/10/2021 9:52 AM Eula Fried., MHA, RVT, RDCS, RDMS

## 2021-01-10 NOTE — Discharge Instructions (Addendum)
You were seen in the emergency department today for leg discomfort as well as some shortness of breath.  Your work-up was overall reassuring.  Your CT scan and ultrasounds of your legs did not show findings of blood clots.  Your CT scan of your chest did show that you have a lung nodule-you will need this to be rechecked in 3 months, this can be done via repeat chest CT, a follow-up PET-CT (imaging), or a tissue sampling.  This is to ensure that this is not a cancerous process.  Your CT scan also showed some fatty changes of your liver and a small hiatal hernia.  Your blood work overall looked good.  Worsening home with a prescription for Robaxin to take every 12 hours as needed for leg discomfort.  This is a muscle relaxer, this can make you sleepy, do not drive or operate heavy machinery when taking this medication, do not take other sedating medications with this medicine.  Do not drink alcohol with this medicine  We have prescribed you new medication(s) today. Discuss the medications prescribed today with your pharmacist as they can have adverse effects and interactions with your other medicines including over the counter and prescribed medications. Seek medical evaluation if you start to experience new or abnormal symptoms after taking one of these medicines, seek care immediately if you start to experience difficulty breathing, feeling of your throat closing, facial swelling, or rash as these could be indications of a more serious allergic reaction   Please follow-up with your primary care provider within 3 days for reevaluation.  Return to the emergency department for new or worsening symptoms including but not limited to increased or new pain, fever, increased trouble breathing, chest pain, passing out, coughing up blood, discoloration of your legs, numbness or weakness in your legs, or any other concerns.

## 2021-01-10 NOTE — ED Provider Notes (Signed)
07:00: Assumed care of patient from Elpidio Anis PA-C at change of shift pending LE doppler study & CTA.   Please see prior provider note for full H&P.   Venous duplex bilaterally- negative for DVT.  CTA: 1.3 cm right lower lobe pulmonary nodule. Consider one of the following in 3 months for both low-risk and high-risk individuals: (a) repeat chest CT, (b) follow-up PET-CT, or (c) tissue sampling. This recommendation follows the consensus statement: Guidelines for Management of Incidental Pulmonary Nodules Detected on CT Images: From the Fleischner Society 2017; Radiology 2017; 284:228-243. Small hiatal hernia. Fatty infiltration of the liver.   No VTE on imaging.  CTA with lung nodule which will need 3 month follow up- this was discussed directly with the patient and included in her discharge instructions. Will provide robaxin to help with her leg discomfort. Close PCP follow up & strict return precautions. I discussed results, treatment plan, need for follow-up, and return precautions with the patient. Provided opportunity for questions, patient confirmed understanding and is in agreement with plan.   Blood pressure 133/72, pulse 83, temperature 98 F (36.7 C), temperature source Oral, resp. rate 15, height 5\' 8"  (1.727 m), weight 108.9 kg, SpO2 100 %.  Results for orders placed or performed during the hospital encounter of 01/10/21  Basic metabolic panel  Result Value Ref Range   Sodium 135 135 - 145 mmol/L   Potassium 3.9 3.5 - 5.1 mmol/L   Chloride 101 98 - 111 mmol/L   CO2 25 22 - 32 mmol/L   Glucose, Bld 144 (H) 70 - 99 mg/dL   BUN 15 8 - 23 mg/dL   Creatinine, Ser 03/12/21 0.44 - 1.00 mg/dL   Calcium 9.2 8.9 - 4.17 mg/dL   GFR, Estimated 40.8 >14 mL/min   Anion gap 9 5 - 15  Magnesium  Result Value Ref Range   Magnesium 2.2 1.7 - 2.4 mg/dL  CBC  Result Value Ref Range   WBC 7.8 4.0 - 10.5 K/uL   RBC 4.46 3.87 - 5.11 MIL/uL   Hemoglobin 13.1 12.0 - 15.0 g/dL   HCT >48 18.5 -  63.1 %   MCV 89.7 80.0 - 100.0 fL   MCH 29.4 26.0 - 34.0 pg   MCHC 32.8 30.0 - 36.0 g/dL   RDW 49.7 02.6 - 37.8 %   Platelets 263 150 - 400 K/uL   nRBC 0.0 0.0 - 0.2 %   DG Chest 2 View  Result Date: 01/10/2021 CLINICAL DATA:  Shortness of breath EXAM: CHEST - 2 VIEW COMPARISON:  12/20/2020 FINDINGS: Persisting 13 mm nodular density at the right base. No edema, effusion, or pneumothorax. Normal heart size. IMPRESSION: Persisting nodular density at the right base, recommend chest CT. Electronically Signed   By: 12/22/2020 M.D.   On: 01/10/2021 05:46   DG Chest 2 View  Result Date: 12/20/2020 CLINICAL DATA:  Episcleritis of the left eye. Clinical concern for autoimmune disorder. History of hypertension. EXAM: CHEST - 2 VIEW COMPARISON:  None. FINDINGS: Normal heart, mediastinum and hila. Small irregular nodular opacity projects in the right lower lung on the frontal view, not visualized on the lateral view. Lungs otherwise clear. No pleural effusion or pneumothorax. Skeletal structures are unremarkable. IMPRESSION: 1. No acute cardiopulmonary disease. 2. Possible small nodule in the right lower lung. Recommend follow-up chest CT without contrast for further assessment. Electronically Signed   By: 12/22/2020 M.D.   On: 12/20/2020 17:02   CT Angio Chest PE W/Cm &/Or  Wo Cm  Result Date: 01/10/2021 CLINICAL DATA:  Shortness of breath for 1 week. Nodular opacity in the right lung base on prior plain films. EXAM: CT ANGIOGRAPHY CHEST WITH CONTRAST TECHNIQUE: Multidetector CT imaging of the chest was performed using the standard protocol during bolus administration of intravenous contrast. Multiplanar CT image reconstructions and MIPs were obtained to evaluate the vascular anatomy. CONTRAST:  65 mL OMNIPAQUE IOHEXOL 350 MG/ML SOLN COMPARISON:  PA and lateral chest and today 12/20/2020. FINDINGS: Cardiovascular: Satisfactory opacification of the pulmonary arteries to the segmental level. No evidence of  pulmonary embolism. Upper normal heart size. No pericardial effusion. Mediastinum/Nodes: No enlarged mediastinal, hilar, or axillary lymph nodes. Thyroid gland, trachea, and esophagus demonstrate no significant findings. Small hiatal hernia noted. Lungs/Pleura: There is a 1.3 cm right lower lobe pulmonary nodule on image 42 of series 6. Mild dependent atelectasis is noted. A small cyst is seen in the left lower lobe. The lungs are otherwise clear. No pleural effusion. Upper Abdomen: There is fatty infiltration of the liver. Imaged intra-abdominal contents are otherwise negative. Musculoskeletal: No acute or focal abnormality. Review of the MIP images confirms the above findings. IMPRESSION: 1.3 cm right lower lobe pulmonary nodule. Consider one of the following in 3 months for both low-risk and high-risk individuals: (a) repeat chest CT, (b) follow-up PET-CT, or (c) tissue sampling. This recommendation follows the consensus statement: Guidelines for Management of Incidental Pulmonary Nodules Detected on CT Images: From the Fleischner Society 2017; Radiology 2017; 284:228-243. Small hiatal hernia. Fatty infiltration of the liver. Electronically Signed   By: Drusilla Kanner M.D.   On: 01/10/2021 10:40   VAS [place] LOWER EXTREMITY VENOUS (DVT) (ONLY [facility] & [facility])  Result Date: 01/10/2021  Lower Venous DVT Study Patient Name:  Donna Chandler  Date of Exam:   01/10/2021 Medical Rec #: [id number]            Accession #:    [id number] Date of Birth: August 14, 1956             Patient Gender: F Patient Age:   53Y Exam Location:  [facility] Procedure:      VAS [place] LOWER EXTREMITY VENOUS (DVT) Referring Phys: [id number] SHARI UPSTILL --------------------------------------------------------------------------------  Indications: Edema and pain in left leg greater than right since September 2021.  Comparison Study: No prior study Performing Technologist: Gertie Fey MHA, RDMS, RVT, RDCS  Examination Guidelines: A complete  evaluation includes B-mode imaging, spectral Doppler, color Doppler, and power Doppler as needed of all accessible portions of each vessel. Bilateral testing is considered an integral part of a complete examination. Limited examinations for reoccurring indications may be performed as noted. The reflux portion of the exam is performed with the patient in reverse Trendelenburg.  +---------+---------------+---------+-----------+----------+--------------+ RIGHT    CompressibilityPhasicitySpontaneityPropertiesThrombus Aging +---------+---------------+---------+-----------+----------+--------------+ CFV      Full           Yes      Yes                                 +---------+---------------+---------+-----------+----------+--------------+ SFJ      Full                                                        +---------+---------------+---------+-----------+----------+--------------+ FV  Prox  Full                                                        +---------+---------------+---------+-----------+----------+--------------+ FV Mid   Full                                                        +---------+---------------+---------+-----------+----------+--------------+ FV DistalFull                                                        +---------+---------------+---------+-----------+----------+--------------+ PFV      Full                                                        +---------+---------------+---------+-----------+----------+--------------+ POP      Full           Yes      Yes                                 +---------+---------------+---------+-----------+----------+--------------+ PTV      Full                                                        +---------+---------------+---------+-----------+----------+--------------+ PERO     Full                                                         +---------+---------------+---------+-----------+----------+--------------+   +---------+---------------+---------+-----------+----------+--------------+ LEFT     CompressibilityPhasicitySpontaneityPropertiesThrombus Aging +---------+---------------+---------+-----------+----------+--------------+ CFV      Full           Yes      Yes                                 +---------+---------------+---------+-----------+----------+--------------+ SFJ      Full                                                        +---------+---------------+---------+-----------+----------+--------------+ FV Prox  Full                                                        +---------+---------------+---------+-----------+----------+--------------+  FV Mid   Full                                                        +---------+---------------+---------+-----------+----------+--------------+ FV DistalFull                                                        +---------+---------------+---------+-----------+----------+--------------+ PFV      Full                                                        +---------+---------------+---------+-----------+----------+--------------+ POP      Full           Yes      Yes                                 +---------+---------------+---------+-----------+----------+--------------+ PTV      Full                                                        +---------+---------------+---------+-----------+----------+--------------+ PERO     Full                                                        +---------+---------------+---------+-----------+----------+--------------+     Summary: RIGHT: - There is no evidence of deep vein thrombosis in the lower extremity.  - No cystic structure found in the popliteal fossa.  LEFT: - There is no evidence of deep vein thrombosis in the lower extremity.  - No cystic structure found in the popliteal fossa.   *See table(s) above for measurements and observations.    Preliminary       Cherly Anderson, PA-C 01/10/21 1103    Margarita Grizzle, MD 01/12/21 1725

## 2021-01-10 NOTE — ED Notes (Signed)
CT notified of IV in place at this time. IV placed by IV team via ultrasound.

## 2021-01-10 NOTE — ED Notes (Signed)
CT sent pt back to room as pt IV blew and was not good for CTA chest. Provider notified.

## 2021-01-10 NOTE — ED Provider Notes (Signed)
Emergency Medicine Provider Triage Evaluation Note  Donna Chandler , a 65 y.o. female  was evaluated in triage.  Pt complains of calf pain in BLE. Describes a cramping pain like a "Charlie horse" that can be aggravated with ambulation. Taking oral magnesium tablets without relief. Did try OTC analgesics today without symptomatic improvement. No hx of trauma, leg swelling, color change, fevers. Hx of HTN, hypothyroid.  Review of Systems  Positive: BLE pain Negative: Leg swelling, trauma  Physical Exam  Ht 5\' 8"  (1.727 m)   Wt 108.9 kg   BMI 36.49 kg/m  Gen:   Awake, no distress   Resp:  Normal effort  MSK:   Moves extremities without difficulty  Other:  DP pulse 2+ BLE. Trace pitting edema to b/l shins.  Medical Decision Making  Medically screening exam initiated at 12:45 AM.  Appropriate orders placed.  Georgenia Hesse was informed that the remainder of the evaluation will be completed by another provider, this initial triage assessment does not replace that evaluation, and the importance of remaining in the ED until their evaluation is complete.   , PA-C 01/10/21 0047    03/12/21, DO 01/10/21 [phone]

## 2021-01-10 NOTE — ED Notes (Signed)
Pt given additional warm blankets  

## 2021-02-11 ENCOUNTER — Other Ambulatory Visit: Payer: Self-pay

## 2021-02-11 ENCOUNTER — Ambulatory Visit: Payer: BC Managed Care – PPO | Admitting: Family Medicine

## 2021-02-11 ENCOUNTER — Encounter: Payer: Self-pay | Admitting: Family Medicine

## 2021-02-11 VITALS — BP 133/86 | HR 89 | Temp 98.2°F | Ht 68.0 in | Wt 247.0 lb

## 2021-02-11 DIAGNOSIS — E669 Obesity, unspecified: Secondary | ICD-10-CM

## 2021-02-11 DIAGNOSIS — R911 Solitary pulmonary nodule: Secondary | ICD-10-CM

## 2021-02-11 DIAGNOSIS — E039 Hypothyroidism, unspecified: Secondary | ICD-10-CM

## 2021-02-11 LAB — TSH: TSH: 2.78 u[IU]/mL (ref 0.35–4.50)

## 2021-02-11 MED ORDER — DOXYCYCLINE HYCLATE 100 MG PO TABS: 100.0000 mg | ORAL_TABLET | Freq: Two times a day (BID) | ORAL | 0 refills | Status: DC

## 2021-02-11 NOTE — Assessment & Plan Note (Signed)
Last TSH still above goal though she has been on Synthroid 125 mcg for the last couple of months.  We will recheck TSH today and adjust dose of Synthroid as needed.

## 2021-02-11 NOTE — Progress Notes (Signed)
   Donna Chandler is a 65 y.o. female who presents today for an office visit.  Assessment/Plan:  Chronic Problems Addressed Today: Hypothyroidism Last TSH still above goal though she has been on Synthroid 125 mcg for the last couple of months.  We will recheck TSH today and adjust dose of Synthroid as needed.  Pulmonary nodule Repeat CT scan in 3 months per radiology recommendations.  Obesity Patient with some difficulty losing weight.  She has tried diet and exercise without significant treatment.  We will need to recheck TSH today.  If her TSH is at goal we will consider trial of Ozempic.     Subjective:  HPI:  See A/P for chronic conditions.  She has had a recurrence of boil on her bottom.  Had this a few months ago.  Treated with doxycycline.  Noted.  Fevers or chills.  She is concerned about her inability to lose weight.  She is interested in possibly medication to help suppress appetite.  She was seen in the ED about a month ago and up having x-ray done showed pulmonary nodule.  She is recommended to have repeat CT scan done in 3 months.       Objective:  Physical Exam: BP 133/86   Pulse 89   Temp 98.2 F (36.8 C) (Temporal)   Ht 5\' 8"  (1.727 m)   Wt 247 lb (112 kg)   SpO2 97%   BMI 37.56 kg/m   Gen: No acute distress, resting comfortably CV: Regular rate and rhythm with no murmurs appreciated Pulm: Normal work of breathing, clear to auscultation bilaterally with no crackles, wheezes, or rhonchi Neuro: Grossly normal, moves all extremities Psych: Normal affect and thought content      Montee Tallman M. , MD 02/11/2021 1:31 PM

## 2021-02-11 NOTE — Assessment & Plan Note (Signed)
Patient with some difficulty losing weight.  She has tried diet and exercise without significant treatment.  We will need to recheck TSH today.  If her TSH is at goal we will consider trial of Ozempic.

## 2021-02-11 NOTE — Assessment & Plan Note (Signed)
Repeat CT scan in 3 months per radiology recommendations.

## 2021-02-11 NOTE — Patient Instructions (Signed)
It was very nice to see you today!  I will send in 2 weeks of doxycycline.  We will check your thyroid level today.  We may need to start Ozempic depending on results of your thyroid.  We will need to repeat your CT scan in 3 months.  Take care, Dr Jimmey Ralph  PLEASE NOTE:  If you had any lab tests please let us know if you have not heard back within a few days. You may see your results on mychart before we have a chance to review them but we will give you a call once they are reviewed by [place]. If we ordered any referrals today, please let us know if you have not heard from their office within the next week.   Please try these tips to maintain a healthy lifestyle:   Eat at least 3 REAL meals and 1-2 snacks per day.  Aim for no more than 5 hours between eating.  If you eat breakfast, please do so within one hour of getting up.    Each meal should contain half fruits/vegetables, one quarter protein, and one quarter carbs (no bigger than a computer mouse)   Cut down on sweet beverages. This includes juice, soda, and sweet tea.     Drink at least 1 glass of water with each meal and aim for at least 8 glasses per day   Exercise at least 150 minutes every week.

## 2021-02-12 NOTE — Progress Notes (Signed)
Please inform patient of the following:  Thyroid level normal.  Would like for her to continue current dose of Synthroid.  Would like for her to let us know what she thinks about starting Ozempic.  We can send this in if she wants.

## 2021-02-13 ENCOUNTER — Other Ambulatory Visit: Payer: Self-pay

## 2021-02-13 ENCOUNTER — Telehealth: Payer: Self-pay

## 2021-02-13 MED ORDER — PHENTERMINE HCL 15 MG PO CAPS: 15.0000 mg | ORAL_CAPSULE | ORAL | 5 refills | Status: DC

## 2021-02-13 MED ORDER — LEVOTHYROXINE SODIUM 125 MCG PO TABS: 125.0000 ug | ORAL_TABLET | Freq: Every day | ORAL | 0 refills | Status: DC

## 2021-02-13 NOTE — Telephone Encounter (Signed)
Patient called in stating she does not want to do the Ozempic for wright loss would rather have phentermine. Also states that she spoke with a nurse yesterday who was supposed to send in another prescription for Synthroid; requesting an update on this.

## 2021-02-13 NOTE — Telephone Encounter (Signed)
Patient aware.

## 2021-02-19 ENCOUNTER — Ambulatory Visit: Payer: BC Managed Care – PPO | Admitting: Family Medicine

## 2021-04-08 ENCOUNTER — Other Ambulatory Visit: Payer: Self-pay | Admitting: *Deleted

## 2021-04-08 MED ORDER — LOSARTAN POTASSIUM 50 MG PO TABS: 50.0000 mg | ORAL_TABLET | Freq: Every day | ORAL | 3 refills | Status: DC

## 2021-04-08 MED ORDER — LEVOTHYROXINE SODIUM 125 MCG PO TABS: 125.0000 ug | ORAL_TABLET | Freq: Every day | ORAL | 2 refills | Status: DC

## 2021-05-07 ENCOUNTER — Telehealth: Payer: Self-pay

## 2021-05-07 NOTE — Telephone Encounter (Signed)
Patient has an appointment on Thursday  Nurse Assessment Nurse: D'Heur Ezzard Standing, RN, Adrienne Date/Time (Eastern Time): 05/06/2021 3:11:35 PM Confirm and document reason for call. If symptomatic, describe symptoms. ---Caller states her blood pressure has been high for about a week. She has had headaches on the top of her head (for past week). Her most recent blood pressure is 145/92 (about an hour ago), even after taking medication. BP was 185/109? yesterday. Does the patient have any new or worsening symptoms? ---Yes Will a triage be completed? ---Yes Related visit to physician within the last 2 weeks? ---No Does the PT have any chronic conditions? (i.e. diabetes, asthma, this includes High risk factors for pregnancy, etc.) ---Yes List chronic conditions. ---HTN, hypothroid Is this a behavioral health or substance abuse call? ---No Guidelines Guideline Title Affirmed Question Affirmed Notes Nurse Date/Time (Eastern Time) Blood Pressure - High Systolic BP >= 160 OR Diastolic >= 100 D'Heur Ezzard Standing, RN, Hansel Starling 05/06/2021 3:17:53 PM PLEASE NOTE: All timestamps contained within this report are represented as [place] Standard Time. CONFIDENTIALTY NOTICE: This fax transmission is intended only for the addressee. It contains information that is legally privileged, confidential or otherwise protected from use or disclosure. If you are not the intended recipient, you are strictly prohibited from reviewing, disclosing, copying using or disseminating any of this information or taking any action in reliance on or regarding this information. If you have received this fax in error, please notify us immediately by telephone so that we can arrange for its return to [place]. Phone: [phone], Toll-Free: [phone], Fax: [phone] Page: 2 of 2 Call Id: [id number] Disp. Time Lamount Cohen Time) Disposition Final User 05/06/2021 2:39:15 PM Send To RN Personal D'Heur Ezzard Standing, RN, Adrienne 05/06/2021  2:53:42 PM Attempt made - message left D'Heur Ezzard Standing, RN, Hansel Starling 05/06/2021 3:22:14 PM SEE PCP WITHIN 3 DAYS Yes D'Heur Ezzard Standing, RN, Hansel Starling Caller Disagree/Comply Comply Caller Understands Yes PreDisposition Call Doctor Care Advice Given Per Guideline SEE PCP WITHIN 3 DAYS: * You need to be seen within 2 or 3 days. HIGH BLOOD PRESSURE - LIFESTYLE MODIFICATIONS: * EXERCISE, BE MORE PHYSICALLY ACTIVE: Do at least 30 minutes of aerobic exercise (e.g., brisk walking) most days of the week. Other examples of aerobic activities cycling, jogging, and swimming. * REDUCE STRESS: Find activities that help reduce your stress. Examples might include meditation, yoga, or even a restful walk in a park. CALL BACK IF: * Weakness or numbness of the face, arm or leg on one side of the body occurs * Difficulty walking, difficulty talking, or severe headache occurs * Chest pain or difficulty breathing occurs * Your blood pressure is over 180/110 * You become worse Comments User: Hansel Starling, D'Heur Ezzard Standing, RN Date/Time Lamount Cohen Time): 05/06/2021 2:38:36 PM Caller asks this nurse to call her back as she is at work & on a call presently. Will call back in five minutes. User: Hansel Starling, D'Heur Ezzard Standing, RN Date/Time Lamount Cohen Time): 05/06/2021 3:15:27 PM Patient took two BP pills yesterday AND an amlodipine. (an old medication

## 2021-05-09 ENCOUNTER — Ambulatory Visit: Payer: BC Managed Care – PPO | Admitting: Physician Assistant

## 2021-05-24 ENCOUNTER — Ambulatory Visit (HOSPITAL_BASED_OUTPATIENT_CLINIC_OR_DEPARTMENT_OTHER): Payer: BC Managed Care – PPO

## 2021-07-04 ENCOUNTER — Ambulatory Visit (HOSPITAL_BASED_OUTPATIENT_CLINIC_OR_DEPARTMENT_OTHER): Payer: BC Managed Care – PPO

## 2022-01-17 ENCOUNTER — Ambulatory Visit (INDEPENDENT_AMBULATORY_CARE_PROVIDER_SITE_OTHER): Payer: 59 | Admitting: Family Medicine

## 2022-01-17 VITALS — BP 129/84 | HR 70 | Temp 97.8°F | Ht 68.0 in | Wt 227.0 lb

## 2022-01-17 DIAGNOSIS — I10 Essential (primary) hypertension: Secondary | ICD-10-CM | POA: Diagnosis not present

## 2022-01-17 DIAGNOSIS — E559 Vitamin D deficiency, unspecified: Secondary | ICD-10-CM

## 2022-01-17 DIAGNOSIS — E039 Hypothyroidism, unspecified: Secondary | ICD-10-CM

## 2022-01-17 DIAGNOSIS — G43809 Other migraine, not intractable, without status migrainosus: Secondary | ICD-10-CM | POA: Diagnosis not present

## 2022-01-17 DIAGNOSIS — E785 Hyperlipidemia, unspecified: Secondary | ICD-10-CM | POA: Diagnosis not present

## 2022-01-17 DIAGNOSIS — R739 Hyperglycemia, unspecified: Secondary | ICD-10-CM | POA: Diagnosis not present

## 2022-01-17 DIAGNOSIS — Z0001 Encounter for general adult medical examination with abnormal findings: Secondary | ICD-10-CM | POA: Diagnosis not present

## 2022-01-17 LAB — COMPREHENSIVE METABOLIC PANEL
ALT: 15 U/L (ref 0–35)
AST: 20 U/L (ref 0–37)
Albumin: 4.5 g/dL (ref 3.5–5.2)
Alkaline Phosphatase: 66 U/L (ref 39–117)
BUN: 12 mg/dL (ref 6–23)
CO2: 29 mEq/L (ref 19–32)
Calcium: 9.8 mg/dL (ref 8.4–10.5)
Chloride: 98 mEq/L (ref 96–112)
Creatinine, Ser: 0.95 mg/dL (ref 0.40–1.20)
GFR: 63.02 mL/min (ref >60.00)
Glucose, Bld: 98 mg/dL (ref 70–99)
Potassium: 3.4 mEq/L — ABNORMAL LOW (ref 3.5–5.1)
Sodium: 136 mEq/L (ref 135–145)
Total Bilirubin: 0.4 mg/dL (ref 0.2–1.2)
Total Protein: 7.4 g/dL (ref 6.0–8.3)

## 2022-01-17 LAB — LIPID PANEL
Cholesterol: 267 mg/dL — ABNORMAL HIGH (ref 0–200)
HDL: 33.5 mg/dL — ABNORMAL LOW (ref >39.00)
NonHDL: 233.6
Total CHOL/HDL Ratio: 8
Triglycerides: 238 mg/dL — ABNORMAL HIGH (ref 0.0–149.0)
VLDL: 47.6 mg/dL — ABNORMAL HIGH (ref 0.0–40.0)

## 2022-01-17 LAB — CBC
HCT: 38 % (ref 36.0–46.0)
Hemoglobin: 12.5 g/dL (ref 12.0–15.0)
MCHC: 33 g/dL (ref 30.0–36.0)
MCV: 87.9 fl (ref 78.0–100.0)
Platelets: 240 10*3/uL (ref 150.0–400.0)
RBC: 4.32 Mil/uL (ref 3.87–5.11)
RDW: 13.8 % (ref 11.5–15.5)
WBC: 6.5 10*3/uL (ref 4.0–10.5)

## 2022-01-17 LAB — LDL CHOLESTEROL, DIRECT: Direct LDL: 208 mg/dL

## 2022-01-17 LAB — TSH: TSH: 2.94 u[IU]/mL (ref 0.35–5.50)

## 2022-01-17 LAB — VITAMIN D 25 HYDROXY (VIT D DEFICIENCY, FRACTURES): VITD: 55.73 ng/mL (ref 30.00–100.00)

## 2022-01-17 LAB — HEMOGLOBIN A1C: Hgb A1c MFr Bld: 7 % — ABNORMAL HIGH (ref 4.6–6.5)

## 2022-01-17 MED ORDER — BUTALBITAL-APAP-CAFFEINE 50-300-40 MG PO CAPS: ORAL_CAPSULE | ORAL | 5 refills | Status: DC

## 2022-01-17 MED ORDER — LOSARTAN POTASSIUM 50 MG PO TABS: 50.0000 mg | ORAL_TABLET | Freq: Every day | ORAL | 3 refills | Status: DC

## 2022-01-17 MED ORDER — LEVOTHYROXINE SODIUM 100 MCG PO TABS: 100.0000 ug | ORAL_TABLET | Freq: Every day | ORAL | 3 refills | Status: DC

## 2022-01-17 NOTE — Assessment & Plan Note (Signed)
Check lipids.  She has declined statins in the past.  Continue lifestyle modifications. ?

## 2022-01-17 NOTE — Assessment & Plan Note (Signed)
No recent flares.  We will refill her Fioricet to use as needed. ?

## 2022-01-17 NOTE — Progress Notes (Signed)
? ?Chief Complaint:  ?Donna Chandler is a 66 y.o. female who presents today for her annual comprehensive physical exam.   ? ?Assessment/Plan:  ?Chronic Problems Addressed Today: ?Dyslipidemia ?Check lipids.  She has declined statins in the past.  Continue lifestyle modifications. ? ?Vitamin D deficiency ?Check vitamin D.  ? ?Hypothyroidism ?On Synthroid 100 mcg daily.  We will check TSH.  Does not need refill today. ? ?Essential hypertension ?At goal today on losartan 50 mg daily and HCTZ 25 mg daily.  Check labs.  We will refill losartan. ? ?Migraines ?No recent flares.  We will refill her Fioricet to use as needed. ? ? ?Preventative Healthcare: ?Up-to-date on Pap.  Due for mammogram soon.  Up-to-date on colonoscopy.  We will check labs.  Vaccines declined. ? ?Patient Counseling(The following topics were reviewed and/or handout was given): ? -Nutrition: Stressed importance of moderation in sodium/caffeine intake, saturated fat and cholesterol, caloric balance, sufficient intake of fresh fruits, vegetables, and fiber. ? -Stressed the importance of regular exercise.  ? -Substance Abuse: Discussed cessation/primary prevention of tobacco, alcohol, or other drug use; driving or other dangerous activities under the influence; availability of treatment for abuse.  ? -Injury prevention: Discussed safety belts, safety helmets, smoke detector, smoking near bedding or upholstery.  ? -Sexuality: Discussed sexually transmitted diseases, partner selection, use of condoms, avoidance of unintended pregnancy and contraceptive alternatives.  ? -Dental health: Discussed importance of regular tooth brushing, flossing, and dental visits. ? -Health maintenance and immunizations reviewed. Please refer to Health maintenance section. ? ?Return to care in 1 year for next preventative visit.  ? ?  ?Subjective:  ?HPI: ? ?She has no acute complaints today.  We will see A/P for status of chronic conditions.  She recently relocated back to  the area.  She had a few medication changes made while away including changing her Synthroid to 100 mcg daily and starting HCTZ 25 mg daily for blood pressure.  She has no longer taking phentermine for weight loss due to it causing elevated blood pressure readings.  She had a CT scan for her pulmonary nodule done recently and she was told she would not need any further follow-up.  She has been working on diet.  She is down about 20 pounds since our visit last year. ? ?Lifestyle ?Diet: Balanced.  ?Exercise: None specific.  ? ? ?  01/17/2022  ?  1:29 PM  ?Depression screen PHQ 2/9  ?Decreased Interest 0  ?Down, Depressed, Hopeless 0  ?PHQ - 2 Score 0  ? ? ?Health Maintenance Due  ?Topic Date Due  ? HIV Screening  Never done  ? Hepatitis C Screening  Never done  ?  ? ?ROS: Per HPI, otherwise a complete review of systems was negative.  ? ?PMH: ? ?The following were reviewed and entered/updated in epic: ?Past Medical History:  ?Diagnosis Date  ? Graves disease   ? Hypertension   ? Scleritis   ? ?Patient Active Problem List  ? Diagnosis Date Noted  ? Pulmonary nodule 02/11/2021  ? Obesity 02/11/2021  ? Leg cramp 11/30/2020  ? Headache 11/30/2020  ? Trigeminal neuralgia 11/30/2020  ? Dyslipidemia 10/19/2020  ? Migraines 10/18/2020  ? Essential hypertension 10/18/2020  ? Hypothyroidism 10/18/2020  ? Colon polyps 10/18/2020  ? Vitamin D deficiency 10/18/2020  ? ?Past Surgical History:  ?Procedure Laterality Date  ? ABDOMINAL HYSTERECTOMY    ? ? ?Family History  ?Problem Relation Age of Onset  ? Heart disease Mother   ? Cancer  Father   ? Cancer Sister 21  ?     colon cancer   ? Cancer Brother 8  ?     pancreatic cancer   ? Cerebral aneurysm Son   ? Heart disease Maternal Grandmother   ? Cancer Maternal Grandfather   ? Heart disease Paternal Grandmother   ? ? ?Medications- reviewed and updated ?Current Outpatient Medications  ?Medication Sig Dispense Refill  ? hydrochlorothiazide (HYDRODIURIL) 25 MG tablet Take 25 mg by mouth  daily.    ? Butalbital-APAP-Caffeine 50-300-40 MG CAPS Take 1-2 tablets as needed every 6 hours. Do not exceed 6 tablets in 24 hours. 90 capsule 5  ? levothyroxine (SYNTHROID) 100 MCG tablet Take 1 tablet (100 mcg total) by mouth daily. 90 tablet 3  ? losartan (COZAAR) 50 MG tablet Take 1 tablet (50 mg total) by mouth daily. 90 tablet 3  ? ?No current facility-administered medications for this visit.  ? ? ?Allergies-reviewed and updated ?Allergies  ?Allergen Reactions  ? Morphine   ?  Other reaction(s): Other (See Comments) ?Other Reaction: OTHER REACTION ?  ? Amlodipine Itching  ? Benazepril Itching  ? Penicillins   ?  Other reaction(s): Unknown  ? Cephalexin Rash  ? ? ?Social History  ? ?Socioeconomic History  ? Marital status: Divorced  ?  Spouse name: Not on file  ? Number of children: Not on file  ? Years of education: Not on file  ? Highest education level: Not on file  ?Occupational History  ? Not on file  ?Tobacco Use  ? Smoking status: Never  ? Smokeless tobacco: Never  ?Substance and Sexual Activity  ? Alcohol use: Never  ? Drug use: Never  ? Sexual activity: Not on file  ?Other Topics Concern  ? Not on file  ?Social History Narrative  ? Not on file  ? ?Social Determinants of Health  ? ?Financial Resource Strain: Not on file  ?Food Insecurity: Not on file  ?Transportation Needs: Not on file  ?Physical Activity: Not on file  ?Stress: Not on file  ?Social Connections: Not on file  ? ?   ?  ?Objective:  ?Physical Exam: ?BP 129/84 (BP Location: Left Arm)   Pulse 70   Temp 97.8 ?F (36.6 ?C) (Temporal)   Ht 5\' 8"  (1.727 m)   Wt 227 lb (103 kg)   SpO2 98%   BMI 34.52 kg/m?   ?Body mass index is 34.52 kg/m?. ?Wt Readings from Last 3 Encounters:  ?01/17/22 227 lb (103 kg)  ?02/11/21 247 lb (112 kg)  ?01/10/21 240 lb (108.9 kg)  ? ?Gen: NAD, resting comfortably ?HEENT: TMs normal bilaterally. OP clear. No thyromegaly noted.  ?CV: RRR with no murmurs appreciated ?Pulm: NWOB, CTAB with no crackles, wheezes, or  rhonchi ?GI: Normal bowel sounds present. Soft, Nontender, Nondistended. ?MSK: no edema, cyanosis, or clubbing noted ?Skin: warm, dry ?Neuro: CN2-12 grossly intact. Strength 5/5 in upper and lower extremities. Reflexes symmetric and intact bilaterally.  ?Psych: Normal affect and thought content ?   ? ?03/12/21. Katina Degree, MD ?01/17/2022 1:51 PM  ?

## 2022-01-17 NOTE — Assessment & Plan Note (Signed)
Check vitamin D. 

## 2022-01-17 NOTE — Assessment & Plan Note (Signed)
On Synthroid 100 mcg daily.  We will check TSH.  Does not need refill today. ?

## 2022-01-17 NOTE — Patient Instructions (Signed)
It was very nice to see you today! ? ?Keep up the good work! ? ?I will refill your medications.  We will check blood work today. ? ?We will see you back in year for your next physical.  Come back sooner if needed. ? ?Take care, ?Dr Jerline Pain ? ?PLEASE NOTE: ? ?If you had any lab tests please let us know if you have not heard back within a few days. You may see your results on mychart before we have a chance to review them but we will give you a call once they are reviewed by [place]. If we ordered any referrals today, please let us know if you have not heard from their office within the next week.  ? ?Please try these tips to maintain a healthy lifestyle: ? ?Eat at least 3 REAL meals and 1-2 snacks per day.  Aim for no more than 5 hours between eating.  If you eat breakfast, please do so within one hour of getting up.  ? ?Each meal should contain half fruits/vegetables, one quarter protein, and one quarter carbs (no bigger than a computer mouse) ? ?Cut down on sweet beverages. This includes juice, soda, and sweet tea.  ? ?Drink at least 1 glass of water with each meal and aim for at least 8 glasses per day ? ?Exercise at least 150 minutes every week.   ? ?Preventive Care 22 Years and Older, Female ?Preventive care refers to lifestyle choices and visits with your health care provider that can promote health and wellness. Preventive care visits are also called wellness exams. ?What can I expect for my preventive care visit? ?Counseling ?Your health care provider may ask you questions about your: ?Medical history, including: ?Past medical problems. ?Family medical history. ?Pregnancy and menstrual history. ?History of falls. ?Current health, including: ?Memory and ability to understand (cognition). ?Emotional well-being. ?Home life and relationship well-being. ?Sexual activity and sexual health. ?Lifestyle, including: ?Alcohol, nicotine or tobacco, and drug use. ?Access to firearms. ?Diet, exercise, and sleep habits. ?Work and  work Statistician. ?Sunscreen use. ?Safety issues such as seatbelt and bike helmet use. ?Physical exam ?Your health care provider will check your: ?Height and weight. These may be used to calculate your BMI (body mass index). BMI is a measurement that tells if you are at a healthy weight. ?Waist circumference. This measures the distance around your waistline. This measurement also tells if you are at a healthy weight and may help predict your risk of certain diseases, such as type 2 diabetes and high blood pressure. ?Heart rate and blood pressure. ?Body temperature. ?Skin for abnormal spots. ?What immunizations do I need? ? ?Vaccines are usually given at various ages, according to a schedule. Your health care provider will recommend vaccines for you based on your age, medical history, and lifestyle or other factors, such as travel or where you work. ?What tests do I need? ?Screening ?Your health care provider may recommend screening tests for certain conditions. This may include: ?Lipid and cholesterol levels. ?Hepatitis C test. ?Hepatitis B test. ?HIV (human immunodeficiency virus) test. ?STI (sexually transmitted infection) testing, if you are at risk. ?Lung cancer screening. ?Colorectal cancer screening. ?Diabetes screening. This is done by checking your blood sugar (glucose) after you have not eaten for a while (fasting). ?Mammogram. Talk with your health care provider about how often you should have regular mammograms. ?BRCA-related cancer screening. This may be done if you have a family history of breast, ovarian, tubal, or peritoneal cancers. ?Bone density scan. This  is done to screen for osteoporosis. ?Talk with your health care provider about your test results, treatment options, and if necessary, the need for more tests. ?Follow these instructions at home: ?Eating and drinking ? ?Eat a diet that includes fresh fruits and vegetables, whole grains, lean protein, and low-fat dairy products. Limit your intake  of foods with high amounts of sugar, saturated fats, and salt. ?Take vitamin and mineral supplements as recommended by your health care provider. ?Do not drink alcohol if your health care provider tells you not to drink. ?If you drink alcohol: ?Limit how much you have to 0-1 drink a day. ?Know how much alcohol is in your drink. In the [place] one drink equals one 12 oz bottle of beer (355 mL), one 5 oz glass of wine (148 mL), or one 1? oz glass of hard liquor (44 mL). ?Lifestyle ?Brush your teeth every morning and night with fluoride toothpaste. Floss one time each day. ?Exercise for at least 30 minutes 5 or more days each week. ?Do not use any products that contain nicotine or tobacco. These products include cigarettes, chewing tobacco, and vaping devices, such as e-cigarettes. If you need help quitting, ask your health care provider. ?Do not use drugs. ?If you are sexually active, practice safe sex. Use a condom or other form of protection in order to prevent STIs. ?Take aspirin only as told by your health care provider. Make sure that you understand how much to take and what form to take. Work with your health care provider to find out whether it is safe and beneficial for you to take aspirin daily. ?Ask your health care provider if you need to take a cholesterol-lowering medicine (statin). ?Find healthy ways to manage stress, such as: ?Meditation, yoga, or listening to music. ?Journaling. ?Talking to a trusted person. ?Spending time with friends and family. ?Minimize exposure to UV radiation to reduce your risk of skin cancer. ?Safety ?Always wear your seat belt while driving or riding in a vehicle. ?Do not drive: ?If you have been drinking alcohol. Do not ride with someone who has been drinking. ?When you are tired or distracted. ?While texting. ?If you have been using any mind-altering substances or drugs. ?Wear a helmet and other protective equipment during sports activities. ?If you have firearms in your  house, make sure you follow all gun safety procedures. ?What's next? ?Visit your health care provider once a year for an annual wellness visit. ?Ask your health care provider how often you should have your eyes and teeth checked. ?Stay up to date on all vaccines. ?This information is not intended to replace advice given to you by your health care provider. Make sure you discuss any questions you have with your health care provider. ?Document Revised: 02/20/2021 Document Reviewed: 02/20/2021 ?Elsevier Patient Education ? [place]. ? ?

## 2022-01-17 NOTE — Assessment & Plan Note (Signed)
At goal today on losartan 50 mg daily and HCTZ 25 mg daily.  Check labs.  We will refill losartan. ?

## 2022-01-21 NOTE — Progress Notes (Signed)
Please inform patient of the following: ? ?Her blood sugar is elevated into the diabetic range and her cholesterol levels are elevated as well.  She has declined medications in the past but she would benefit from starting medications to improve these numbers and lower her risk of heart attack and stroke.  Please send in Lipitor 40 mg daily and metformin 500 mg daily if she is willing to start.  We should recheck in 3 to 6 months. ? ?All of her other labs are normal and we can recheck in a year.

## 2022-02-19 ENCOUNTER — Other Ambulatory Visit: Payer: Self-pay | Admitting: Family Medicine

## 2022-02-19 DIAGNOSIS — E039 Hypothyroidism, unspecified: Secondary | ICD-10-CM

## 2022-03-15 ENCOUNTER — Encounter (HOSPITAL_COMMUNITY): Payer: Self-pay | Admitting: *Deleted

## 2022-03-15 ENCOUNTER — Other Ambulatory Visit: Payer: Self-pay

## 2022-03-15 ENCOUNTER — Ambulatory Visit (HOSPITAL_COMMUNITY)
Admission: EM | Admit: 2022-03-15 | Discharge: 2022-03-15 | Disposition: A | Discharge status: Home / Self Care | Payer: 59 | Attending: Internal Medicine | Admitting: Internal Medicine

## 2022-03-15 DIAGNOSIS — N898 Other specified noninflammatory disorders of vagina: Secondary | ICD-10-CM

## 2022-03-15 DIAGNOSIS — R102 Pelvic and perineal pain: Secondary | ICD-10-CM

## 2022-03-15 LAB — POCT URINALYSIS DIPSTICK, ED / UC
Bilirubin Urine: NEGATIVE
Glucose, UA: NEGATIVE mg/dL
Ketones, ur: NEGATIVE mg/dL
Leukocytes,Ua: NEGATIVE
Nitrite: NEGATIVE
Protein, ur: NEGATIVE mg/dL
Specific Gravity, Urine: 1.025 (ref 1.005–1.030)
Urobilinogen, UA: 1 mg/dL (ref 0.0–1.0)
pH: 5.5 (ref 5.0–8.0)

## 2022-03-15 NOTE — Discharge Instructions (Addendum)
Your urine does not show urinary tract infection today.  I have sent your urine for culture just to be sure that it does not grow any bacteria in the lab requiring an antibiotic.  You will receive a phone call from [place] if your urine grows any bacteria in the next 2 to 3 days.  You may take Tylenol 1000 mg every 6 hours as needed for any abdominal discomfort or low back pain you may have.  We have sent your STI testing off for evaluation.  We will call you in the next 2 to 3 days if any of your tests are positive requiring treatment.  Schedule an appointment with your OB/GYN for further evaluation of your vaginal dryness.  Do not put any further oils to the outside of your vagina as this is likely causing irritation to your vaginal canal.  Increase your water intake to at least 64 ounces of water per day to stay well-hydrated.  If you develop any new or worsening symptoms or do not improve in the next 2 to 3 days, please return.  If your symptoms are severe, please go to the emergency room.  Follow-up with your primary care provider for further evaluation and management of your symptoms as well as ongoing wellness visits.  I hope you feel better!

## 2022-03-15 NOTE — ED Provider Notes (Signed)
[facility]    CSN: [id number] Arrival date & time: 03/15/22  1505      History   Chief Complaint Chief Complaint  Patient presents with   Back Pain   Pelvic Pain   Vaginal Discharge    HPI Donna Chandler is a 66 y.o. female.   Patient presents to urgent care for evaluation of her lower back pain, pelvic pain, and vaginal irritation ever since she attempted to use some natural oils to the external vagina and attempt to provide lubrication to the area 2 days ago.  Patient has applied different types of oil to her external vagina in the past due to vaginal dryness and has never experienced vaginal discomfort like this.  She denies urinary frequency, urgency, dysuria, fever/chills, and vaginal odor.  She noticed a small amount of thin white vaginal discharge on her underwear this morning and reports a small amount of vaginal itching mostly to the external vagina.  No recent new sexual partners reported.  Denies recent known exposure to STIs.  Denies frequent urinary tract infections.  No vaginal bleeding reported.  She states that only the outside of her vagina is dry and that the internal vagina is lubricated.  She is experiencing a small amount of suprapubic abdominal pain that is very mild.  Last normal bowel movement was yesterday.  No diarrhea reported and no recent antibiotic use/abdominal surgeries.  No aggravating or relieving factors identified at this time for patient's symptoms.   Back Pain Associated symptoms: pelvic pain   Pelvic Pain  Vaginal Discharge   Past Medical History:  Diagnosis Date   Graves disease    Hypertension    Scleritis     Patient Active Problem List   Diagnosis Date Noted   Pulmonary nodule 02/11/2021   Obesity 02/11/2021   Leg cramp 11/30/2020   Headache 11/30/2020   Trigeminal neuralgia 11/30/2020   Dyslipidemia 10/19/2020   Migraines 10/18/2020   Essential hypertension 10/18/2020   Hypothyroidism 10/18/2020   Colon  polyps 10/18/2020   Vitamin D deficiency 10/18/2020    Past Surgical History:  Procedure Laterality Date   ABDOMINAL HYSTERECTOMY      OB History   No obstetric history on file.      Home Medications    Prior to Admission medications   Medication Sig Start Date End Date Taking? Authorizing Provider  Butalbital-APAP-Caffeine 50-300-40 MG CAPS Take 1-2 tablets as needed every 6 hours. Do not exceed 6 tablets in 24 hours. 01/17/22   Ardith Dark, MD  hydrochlorothiazide (HYDRODIURIL) 25 MG tablet Take 25 mg by mouth daily.    [provider]  levothyroxine (SYNTHROID) 100 MCG tablet TAKE 1 TABLET BY MOUTH EVERY DAY 02/19/22   Ardith Dark, MD  losartan (COZAAR) 50 MG tablet Take 1 tablet (50 mg total) by mouth daily. 01/17/22   Ardith Dark, MD    Family History Family History  Problem Relation Age of Onset   Heart disease Mother    Cancer Father    Cancer Sister 36       colon cancer    Cancer Brother 12       pancreatic cancer    Cerebral aneurysm Son    Heart disease Maternal Grandmother    Cancer Maternal Grandfather    Heart disease Paternal Grandmother     Social History Social History   Tobacco Use   Smoking status: Never   Smokeless tobacco: Never  Substance Use Topics   Alcohol  use: Never   Drug use: Never     Allergies   Morphine, Amlodipine, Benazepril, Penicillins, and Cephalexin   Review of Systems Review of Systems  Genitourinary:  Positive for pelvic pain and vaginal discharge.  Musculoskeletal:  Positive for back pain.  Per HPI   Physical Exam Triage Vital Signs ED Triage Vitals  Enc Vitals Group     BP 03/15/22 1635 (!) 146/86     Pulse Rate 03/15/22 1635 78     Resp 03/15/22 1635 18     Temp 03/15/22 1635 98.2 F (36.8 C)     Temp src --      SpO2 03/15/22 1635 98 %     Weight --      Height --      Head Circumference --      Peak Flow --      Pain Score 03/15/22 1633 5     Pain Loc --      Pain Edu? --       Excl. in GC? --    No data found.  Updated Vital Signs BP (!) 146/86   Pulse 78   Temp 98.2 F (36.8 C)   Resp 18   SpO2 98%   Visual Acuity Right Eye Distance:   Left Eye Distance:   Bilateral Distance:    Right Eye Near:   Left Eye Near:    Bilateral Near:     Physical Exam Vitals and nursing note reviewed.  Constitutional:      Appearance: Normal appearance. She is not ill-appearing or toxic-appearing.     Comments: Very pleasant patient sitting on exam in position of comfort table in no acute distress.   HENT:     Head: Normocephalic and atraumatic.     Right Ear: Hearing and external ear normal.     Left Ear: Hearing and external ear normal.     Nose: Nose normal.     Mouth/Throat:     Lips: Pink.     Mouth: Mucous membranes are moist.  Eyes:     General: Lids are normal. Vision grossly intact. Gaze aligned appropriately.     Extraocular Movements: Extraocular movements intact.     Conjunctiva/sclera: Conjunctivae normal.  Pulmonary:     Effort: Pulmonary effort is normal.  Abdominal:     General: Bowel sounds are normal.     Palpations: Abdomen is soft.     Tenderness: There is no abdominal tenderness. There is no right CVA tenderness or left CVA tenderness.  Musculoskeletal:     Cervical back: Normal and neck supple.     Thoracic back: Normal.     Lumbar back: Tenderness present. No swelling, signs of trauma or bony tenderness. Normal range of motion.     Comments: Mild tenderness to the paraspinal muscles of the lumbar spine with palpation.  No redness, warmth, ecchymosis, or obvious signs of injury or trauma to the area.  Normal range of motion with forward and lateral flexion of the spine at the hips.  No pain or radiculopathy elicited with palpation of bilateral sciatic joints  Skin:    General: Skin is warm and dry.     Capillary Refill: Capillary refill takes less than 2 seconds.     Findings: No rash.  Neurological:     General: No focal deficit  present.     Mental Status: She is alert and oriented to person, place, and time. Mental status is at baseline.  Cranial Nerves: No dysarthria or facial asymmetry.     Gait: Gait is intact.  Psychiatric:        Mood and Affect: Mood normal.        Speech: Speech normal.        Behavior: Behavior normal.        Thought Content: Thought content normal.        Judgment: Judgment normal.      UC Treatments / Results  Labs (all labs ordered are listed, but only abnormal results are displayed) Labs Reviewed  POCT URINALYSIS DIPSTICK, ED / UC - Abnormal; Notable for the following components:      Result Value   Hgb urine dipstick SMALL (*)    All other components within normal limits  URINE CULTURE  CERVICOVAGINAL ANCILLARY ONLY    EKG   Radiology No results found.  Procedures Procedures (including critical care time)  Medications Ordered in UC Medications - No data to display  Initial Impression / Assessment and Plan / UC Course  I have reviewed the triage vital signs and the nursing notes.  Pertinent labs & imaging results that were available during my care of the patient were reviewed by me and considered in my medical decision making (see chart for details).  1.  Vaginal irritation Patient instructed to no longer use oils to the external vagina as this is causing irritation.  STI labs pending.  Plan to treat per protocol.  Patient to follow-up with her OB/GYN as she may benefit from/be a candidate for estrogen vaginal cream to treat vaginal dryness.  Urinalysis is negative for urinary tract infection, but does show small amount of hematuria.  Urine culture sent and pending.  Offered external vaginal exam today and patient declined.  She may take Tylenol 1000 mg every 6 hours as needed for any abdominal discomfort or low back discomfort she may have.  Encouraged patient to increase her water intake to at least 64 ounces of water per day to maintain adequate  hydration.  Discussed physical exam and available lab work findings in clinic with patient.  Counseled patient regarding appropriate use of medications and potential side effects for all medications recommended or prescribed today. Discussed red flag signs and symptoms of worsening condition,when to call the PCP office, return to urgent care, and when to seek higher level of care in the emergency department. Patient verbalizes understanding and agreement with plan. All questions answered. Patient discharged in stable condition.  Final Clinical Impressions(s) / UC Diagnoses   Final diagnoses:  Vaginal irritation  Pelvic pain in female     Discharge Instructions      Your urine does not show urinary tract infection today.  I have sent your urine for culture just to be sure that it does not grow any bacteria in the lab requiring an antibiotic.  You will receive a phone call from [place] if your urine grows any bacteria in the next 2 to 3 days.  You may take Tylenol 1000 mg every 6 hours as needed for any abdominal discomfort or low back pain you may have.  We have sent your STI testing off for evaluation.  We will call you in the next 2 to 3 days if any of your tests are positive requiring treatment.  Schedule an appointment with your OB/GYN for further evaluation of your vaginal dryness.  Do not put any further oils to the outside of your vagina as this is likely causing irritation to your vaginal  canal.  Increase your water intake to at least 64 ounces of water per day to stay well-hydrated.  If you develop any new or worsening symptoms or do not improve in the next 2 to 3 days, please return.  If your symptoms are severe, please go to the emergency room.  Follow-up with your primary care provider for further evaluation and management of your symptoms as well as ongoing wellness visits.  I hope you feel better!     ED Prescriptions   None    PDMP not reviewed this encounter.   Carlisle Beers, [place] 03/17/22 1152

## 2022-03-15 NOTE — ED Triage Notes (Signed)
Pt reports she has been using different oils to keep herself moist . Pt now thinks she has a bacterial infection. Pt has pelvic and back pain. Vag discharge white.

## 2022-03-16 LAB — URINE CULTURE: Culture: NO GROWTH

## 2022-03-17 LAB — CERVICOVAGINAL ANCILLARY ONLY
Bacterial Vaginitis (gardnerella): NEGATIVE
Candida Glabrata: NEGATIVE
Candida Vaginitis: NEGATIVE
Chlamydia: NEGATIVE
Comment: NEGATIVE
Comment: NEGATIVE
Comment: NEGATIVE
Comment: NEGATIVE
Comment: NEGATIVE
Comment: NORMAL
Neisseria Gonorrhea: NEGATIVE
Trichomonas: NEGATIVE

## 2022-05-09 ENCOUNTER — Encounter: Payer: Self-pay | Admitting: Family Medicine

## 2022-05-09 ENCOUNTER — Ambulatory Visit (INDEPENDENT_AMBULATORY_CARE_PROVIDER_SITE_OTHER): Payer: 59 | Admitting: Family Medicine

## 2022-05-09 VITALS — BP 116/78 | HR 80 | Temp 98.2°F | Ht 68.0 in | Wt 219.6 lb

## 2022-05-09 DIAGNOSIS — I10 Essential (primary) hypertension: Secondary | ICD-10-CM | POA: Diagnosis not present

## 2022-05-09 DIAGNOSIS — E669 Obesity, unspecified: Secondary | ICD-10-CM | POA: Diagnosis not present

## 2022-05-09 DIAGNOSIS — R739 Hyperglycemia, unspecified: Secondary | ICD-10-CM | POA: Diagnosis not present

## 2022-05-09 MED ORDER — PHENTERMINE HCL 15 MG PO CAPS: 15.0000 mg | ORAL_CAPSULE | ORAL | 2 refills | Status: DC

## 2022-05-09 NOTE — Patient Instructions (Signed)
It was very nice to see you today!  Keep up the good work!  We will refill phentermine today.  Please let me know if you have any issues with this.  I will see back in 3 months.  Come back sooner if needed.  Take care, Dr Jimmey Ralph  PLEASE NOTE:  If you had any lab tests please let us know if you have not heard back within a few days. You may see your results on mychart before we have a chance to review them but we will give you a call once they are reviewed by [place]. If we ordered any referrals today, please let us know if you have not heard from their office within the next week.   Please try these tips to maintain a healthy lifestyle:  Eat at least 3 REAL meals and 1-2 snacks per day.  Aim for no more than 5 hours between eating.  If you eat breakfast, please do so within one hour of getting up.   Each meal should contain half fruits/vegetables, one quarter protein, and one quarter carbs (no bigger than a computer mouse)  Cut down on sweet beverages. This includes juice, soda, and sweet tea.   Drink at least 1 glass of water with each meal and aim for at least 8 glasses per day  Exercise at least 150 minutes every week.

## 2022-05-09 NOTE — Assessment & Plan Note (Signed)
Blood pressure is at goal today on losartan 50 mg daily and HCTZ 25 mg daily.  Do not think she will have any issues with starting phentermine.

## 2022-05-09 NOTE — Assessment & Plan Note (Signed)
She took an A1c test at home recently was 6.3.  She is working on diet and exercise and has lost about 8 pounds since her last visit.

## 2022-05-09 NOTE — Progress Notes (Signed)
   Donna Chandler is a 66 y.o. female who presents today for an office visit.  Assessment/Plan:  Chronic Problems Addressed Today: Hyperglycemia She took an A1c test at home recently was 6.3.  She is working on diet and exercise and has lost about 8 pounds since her last visit.  Obesity She is working on diet and exercise.  She has lost 8 pounds since our last visit about 30 pounds over the past year.  Congratulated patient on hard work.  We discussed diet and exercise today.  We will restart phentermine as this is worked well for her in the past.  Blood pressures well controlled today.  She will follow-up in a few weeks via MyChart.  Follow-up made for in person office visit in 3 months.  Essential hypertension Blood pressure is at goal today on losartan 50 mg daily and HCTZ 25 mg daily.  Do not think she will have any issues with starting phentermine.    Subjective:  HPI:  See A/p for status of chronic conditions.         Objective:  Physical Exam: BP 116/78   Pulse 80   Temp 98.2 F (36.8 C) (Temporal)   Ht 5\' 8"  (1.727 m)   Wt 219 lb 9.6 oz (99.6 kg)   SpO2 98%   BMI 33.39 kg/m   Wt Readings from Last 3 Encounters:  05/09/22 219 lb 9.6 oz (99.6 kg)  01/17/22 227 lb (103 kg)  02/11/21 247 lb (112 kg)  Gen: No acute distress, resting comfortably CV: Regular rate and rhythm with no murmurs appreciated Pulm: Normal work of breathing, clear to auscultation bilaterally with no crackles, wheezes, or rhonchi Neuro: Grossly normal, moves all extremities Psych: Normal affect and thought content      Donna Chandler M. 04/13/21, MD 05/09/2022 10:32 AM

## 2022-05-09 NOTE — Assessment & Plan Note (Signed)
She is working on diet and exercise.  She has lost 8 pounds since our last visit about 30 pounds over the past year.  Congratulated patient on hard work.  We discussed diet and exercise today.  We will restart phentermine as this is worked well for her in the past.  Blood pressures well controlled today.  She will follow-up in a few weeks via MyChart.  Follow-up made for in person office visit in 3 months.

## 2022-06-10 ENCOUNTER — Telehealth: Payer: Self-pay | Admitting: Family Medicine

## 2022-06-10 MED ORDER — PHENTERMINE HCL 37.5 MG PO CAPS: 37.5000 mg | ORAL_CAPSULE | ORAL | 2 refills | Status: DC

## 2022-06-10 NOTE — Telephone Encounter (Signed)
Please advise 

## 2022-06-10 NOTE — Telephone Encounter (Signed)
x phentermine 15mg , not effective. - Eating more.  Can up dosage or prescribe something else.   [facility] [address]  Pt States: -Has been taking Phentermine and eating has increased. -gained 3 lbs -[facility] [address].    Pt requestsd: -PCP team up dosage of phentermine or prescribe an alternative medication.    Pt decline ov

## 2022-10-07 ENCOUNTER — Telehealth: Payer: Self-pay | Admitting: Family Medicine

## 2022-10-07 NOTE — Telephone Encounter (Signed)
Patient states the cost of Butalbital-APAP-Caffeine 50-300-40 MG CAPS   Has gone up and is too expensive.  Requests RX for an affordable alternative to the medication listed above be sent to:   [id number] [place], [place] - [place] Phone: [phone]  Fax: [phone]

## 2022-10-08 NOTE — Telephone Encounter (Signed)
Patient called for an update. Informed patient it would hurry process along if she called insurance to see what other alternatives they have that are covered. Pt states she will just wait on pcp response.

## 2022-10-09 ENCOUNTER — Other Ambulatory Visit: Payer: Self-pay | Admitting: Family Medicine

## 2022-10-09 DIAGNOSIS — E039 Hypothyroidism, unspecified: Secondary | ICD-10-CM

## 2022-10-09 MED ORDER — SUMATRIPTAN SUCCINATE 50 MG PO TABS: 50.0000 mg | ORAL_TABLET | ORAL | 0 refills | Status: DC | PRN

## 2022-10-09 NOTE — Telephone Encounter (Signed)
Please advise 

## 2022-10-09 NOTE — Addendum Note (Signed)
Addended by: Vivi Barrack on: 10/09/2022 01:07 PM   Modules accepted: Orders

## 2022-10-20 ENCOUNTER — Other Ambulatory Visit (HOSPITAL_COMMUNITY): Payer: Self-pay

## 2022-10-20 NOTE — Telephone Encounter (Signed)
That is ok to switch if patient is ok with it.  Donna Chandler. Jerline Pain, MD 10/20/2022 2:52 PM

## 2022-10-20 NOTE — Telephone Encounter (Signed)
Received PA request for Butalbital-APAP-Caffeine 50-300-72m CAPSULES, test bill shows price of $240. If change is appropriate, Butalbital-acetaminophen-caffeine 50-325-426mis covered at $15.00 for 30 day supply, $45.00 for 90 day supply (priced may vary due to filling pharmacy). Please change if appropriate. No PA submitted at this time.

## 2022-10-20 NOTE — Telephone Encounter (Signed)
Please advise 

## 2022-10-22 ENCOUNTER — Other Ambulatory Visit: Payer: Self-pay

## 2022-10-22 NOTE — Telephone Encounter (Signed)
Patient is agreeable to newer dose. New dose sent to PCP for approval.

## 2022-10-23 MED ORDER — BUTALBITAL-APAP-CAFFEINE 50-325-40 MG PO TABS: 1.0000 | ORAL_TABLET | Freq: Four times a day (QID) | ORAL | 0 refills | Status: DC | PRN

## 2022-11-19 ENCOUNTER — Telehealth: Payer: Self-pay | Admitting: Family Medicine

## 2022-11-19 NOTE — Telephone Encounter (Signed)
Contacted Donna Chandler to schedule their annual wellness visit. Patient declined to schedule AWV at this time.  [place] Direct Dial [phone]

## 2022-11-28 ENCOUNTER — Ambulatory Visit: Payer: 59 | Admitting: Family

## 2022-11-28 VITALS — BP 118/79 | HR 83 | Temp 97.6°F | Ht 68.0 in | Wt 232.5 lb

## 2022-11-28 DIAGNOSIS — R053 Chronic cough: Secondary | ICD-10-CM | POA: Diagnosis not present

## 2022-11-28 DIAGNOSIS — G43809 Other migraine, not intractable, without status migrainosus: Secondary | ICD-10-CM

## 2022-11-28 MED ORDER — AZITHROMYCIN 250 MG PO TABS: ORAL_TABLET | ORAL | 0 refills | Status: AC

## 2022-11-28 MED ORDER — BUTALBITAL-APAP-CAFFEINE 50-300-40 MG PO CAPS: ORAL_CAPSULE | ORAL | 5 refills | Status: DC

## 2022-11-28 MED ORDER — PREDNISONE 20 MG PO TABS: ORAL_TABLET | ORAL | 0 refills | Status: DC

## 2022-11-28 NOTE — Progress Notes (Signed)
Patient ID: Donna Chandler, female    DOB: [birth year], 67 y.o.   MRN: [id number]  Chief Complaint  Patient presents with   Cough    Pt c/o cough with yellow mucus, Present for 2 weeks. Has tried vitamins and mucinex which did not help.     HPI:      Cough:  Pt c/o cough with yellow mucus, Present for 2 weeks. Has tried vitamins and mucinex which did not help. Denies any sinus sx, no fever or body aches, takes Zyrtec for allergy symptoms, but has not bothered her yet.  Migraines:  reports chronic hx, was using Fioricet but became too expensive and PCP sent Imitrex to try but she says it took 2 hours to starting working. Since then she contacted her insurance co and they told her about a coupon she can use. Pt states she also prefers the capsules vs tablets.  Assessment & Plan:  1. Persistent cough - sending pred and Zpack, advised pt on use & SE. Increase water intake, Tylenol for pain or fever. Nasal saline spray to use for allergy season to avoid sinus infection.  - predniSONE (DELTASONE) 20 MG tablet; Take 2 pills in the morning with breakfast for 3 days, then 1 pill for 2 days  Dispense: 8 tablet; Refill: 0 - azithromycin (ZITHROMAX) 250 MG tablet; Take 2 tablets on day 1, then 1 tablet daily on days 2 through 5  Dispense: 6 tablet; Refill: 0  2. Other migraine without status migrainosus, not intractable - sending refill. Advised pt on limited use, avoid migraine triggers, restart allergy meds.  - Butalbital-APAP-Caffeine 50-300-40 MG CAPS; Take 1-2 tablets as needed every 6 hours. Do not exceed 6 tablets in 24 hours.  Dispense: 90 capsule; Refill: 5  Subjective:    Outpatient Medications Prior to Visit  Medication Sig Dispense Refill   butalbital-acetaminophen-caffeine (FIORICET) 50-325-40 MG tablet Take 1 tablet by mouth every 6 (six) hours as needed for headache. 14 tablet 0   hydrochlorothiazide (HYDRODIURIL) 25 MG tablet Take 25 mg by mouth daily.     levothyroxine  (SYNTHROID) 100 MCG tablet TAKE 1 TABLET BY MOUTH EVERY DAY 90 tablet 1   losartan (COZAAR) 50 MG tablet TAKE 1 TABLET BY MOUTH EVERY DAY 90 tablet 1   phentermine 37.5 MG capsule Take 1 capsule (37.5 mg total) by mouth every morning. 30 capsule 2   SUMAtriptan (IMITREX) 50 MG tablet Take 1 tablet (50 mg total) by mouth every 2 (two) hours as needed for migraine. May repeat in 2 hours if headache persists or recurs. (Patient not taking: Reported on 11/28/2022) 10 tablet 0   No facility-administered medications prior to visit.   Past Medical History:  Diagnosis Date   Graves disease    Hypertension    Scleritis    Past Surgical History:  Procedure Laterality Date   ABDOMINAL HYSTERECTOMY     Allergies  Allergen Reactions   Morphine     Other reaction(s): Other (See Comments) Other Reaction: OTHER REACTION    Amlodipine Itching   Benazepril Itching   Penicillins     Other reaction(s): Unknown   Cephalexin Rash      Objective:    Physical Exam Vitals and nursing note reviewed.  Constitutional:      Appearance: Normal appearance.  Cardiovascular:     Rate and Rhythm: Normal rate and regular rhythm.  Pulmonary:     Effort: Pulmonary effort is normal.     Breath sounds: Examination of the  right-upper field reveals rhonchi. Examination of the left-upper field reveals rhonchi. Rhonchi present.  Musculoskeletal:        General: Normal range of motion.  Skin:    General: Skin is warm and dry.  Neurological:     Mental Status: She is alert.  Psychiatric:        Mood and Affect: Mood normal.        Behavior: Behavior normal.    There were no vitals taken for this visit. Wt Readings from Last 3 Encounters:  05/09/22 219 lb 9.6 oz (99.6 kg)  01/17/22 227 lb (103 kg)  02/11/21 247 lb (112 kg)      Jeanie Sewer, NP

## 2022-12-11 ENCOUNTER — Encounter: Payer: Self-pay | Admitting: Physician Assistant

## 2022-12-11 ENCOUNTER — Ambulatory Visit (INDEPENDENT_AMBULATORY_CARE_PROVIDER_SITE_OTHER): Payer: 59 | Admitting: Physician Assistant

## 2022-12-11 VITALS — BP 120/82 | HR 91 | Temp 97.8°F | Ht 68.0 in | Wt 234.0 lb

## 2022-12-11 DIAGNOSIS — R052 Subacute cough: Secondary | ICD-10-CM

## 2022-12-11 MED ORDER — AZITHROMYCIN 250 MG PO TABS: ORAL_TABLET | ORAL | 0 refills | Status: AC

## 2022-12-11 NOTE — Progress Notes (Signed)
Donna Chandler is a 67 y.o. female here for a follow up of a pre-existing problem.  History of Present Illness:   Chief Complaint  Patient presents with   Cough    Pt still c/o cough and chest congestion, expectorating yellow mainly in the morning, coughing spells. Denies fever or chills. Pt completed Z-pak and prednisone.    Cough Saw H[place] NP at our office for cough x 2 weeks on 11/28/22 Was give zpack and steroids - took as prescribed Did improve  Reports that when she has these infections, usually requires second zpack Cough ongoing, coughing up yellow  She is taking zyrtec Has been taking mucinex intermittently  Denies: fevers, chills, SOB, chest pain/tightness   Past Medical History:  Diagnosis Date   Graves disease    Hypertension    Scleritis      Social History   Tobacco Use   Smoking status: Never   Smokeless tobacco: Never  Substance Use Topics   Alcohol use: Never   Drug use: Never    Past Surgical History:  Procedure Laterality Date   ABDOMINAL HYSTERECTOMY      Family History  Problem Relation Age of Onset   Heart disease Mother    Cancer Father    Cancer Sister 93       colon cancer    Cancer Brother 59       pancreatic cancer    Cerebral aneurysm Son    Heart disease Maternal Grandmother    Cancer Maternal Grandfather    Heart disease Paternal Grandmother     Allergies  Allergen Reactions   Morphine     Other reaction(s): Other (See Comments) Other Reaction: OTHER REACTION    Amlodipine Itching   Benazepril Itching   Penicillins     Other reaction(s): Unknown   Cephalexin Rash    Current Medications:   Current Outpatient Medications:    Butalbital-APAP-Caffeine 50-300-40 MG CAPS, Take 1-2 tablets as needed every 6 hours. Do not exceed 6 tablets in 24 hours., Disp: 90 capsule, Rfl: 5   hydrochlorothiazide (HYDRODIURIL) 25 MG tablet, Take 25 mg by mouth daily., Disp: , Rfl:    levothyroxine (SYNTHROID) 100 MCG tablet,  TAKE 1 TABLET BY MOUTH EVERY DAY, Disp: 90 tablet, Rfl: 1   losartan (COZAAR) 50 MG tablet, TAKE 1 TABLET BY MOUTH EVERY DAY, Disp: 90 tablet, Rfl: 1   phentermine 15 MG capsule, Take 15 mg by mouth every morning., Disp: , Rfl:    Review of Systems:   Review of Systems  Respiratory:  Positive for cough.    Negative unless otherwise specified per HPI.   Vitals:   Vitals:   12/11/22 1111  BP: 120/82  Pulse: 91  Temp: 97.8 F (36.6 C)  TempSrc: Temporal  SpO2: 96%  Weight: 234 lb (106.1 kg)  Height: 5\' 8"  (1.727 m)     Body mass index is 35.58 kg/m.  Physical Exam:   Physical Exam Vitals and nursing note reviewed.  Constitutional:      General: She is not in acute distress.    Appearance: She is well-developed. She is not ill-appearing or toxic-appearing.  HENT:     Head: Normocephalic and atraumatic.     Right Ear: Tympanic membrane, ear canal and external ear normal. Tympanic membrane is not erythematous, retracted or bulging.     Left Ear: Tympanic membrane, ear canal and external ear normal. Tympanic membrane is not erythematous, retracted or bulging.     Nose: Nose normal.  Right Sinus: No maxillary sinus tenderness or frontal sinus tenderness.     Left Sinus: No maxillary sinus tenderness or frontal sinus tenderness.     Mouth/Throat:     Pharynx: Uvula midline. No posterior oropharyngeal erythema.  Eyes:     General: Lids are normal.     Conjunctiva/sclera: Conjunctivae normal.  Neck:     Trachea: Trachea normal.  Cardiovascular:     Rate and Rhythm: Normal rate and regular rhythm.     Heart sounds: Normal heart sounds, S1 normal and S2 normal.  Pulmonary:     Effort: Pulmonary effort is normal.     Breath sounds: Normal breath sounds. No decreased breath sounds, wheezing, rhonchi or rales.  Lymphadenopathy:     Cervical: No cervical adenopathy.  Skin:    General: Skin is warm and dry.  Neurological:     Mental Status: She is alert.  Psychiatric:         Speech: Speech normal.        Behavior: Behavior normal. Behavior is cooperative.     Assessment and Plan:   Subacute cough No red flags on exam.  Will initiate azithromycin per orders. Discussed taking medications as prescribed. Reviewed return precautions including worsening fever, SOB, worsening cough or other concerns. Push fluids and rest. I recommend that patient follow-up if symptoms worsen or persist despite treatment x 7-10 days, sooner if needed.  Also discussed trialing possible Flonase   If sx persist, will likely need CXR      Inda Coke, PA-C

## 2023-01-05 ENCOUNTER — Encounter: Payer: Self-pay | Admitting: Family Medicine

## 2023-01-05 ENCOUNTER — Ambulatory Visit (INDEPENDENT_AMBULATORY_CARE_PROVIDER_SITE_OTHER): Payer: 59 | Admitting: Family Medicine

## 2023-01-05 VITALS — BP 134/84 | HR 80 | Temp 97.8°F | Resp 16 | Ht 68.0 in | Wt 232.2 lb

## 2023-01-05 DIAGNOSIS — E785 Hyperlipidemia, unspecified: Secondary | ICD-10-CM | POA: Diagnosis not present

## 2023-01-05 DIAGNOSIS — I10 Essential (primary) hypertension: Secondary | ICD-10-CM

## 2023-01-05 DIAGNOSIS — E669 Obesity, unspecified: Secondary | ICD-10-CM

## 2023-01-05 MED ORDER — BENZONATATE 200 MG PO CAPS: 200.0000 mg | ORAL_CAPSULE | Freq: Two times a day (BID) | ORAL | 0 refills | Status: DC | PRN

## 2023-01-05 MED ORDER — DOXYCYCLINE HYCLATE 100 MG PO TABS: 100.0000 mg | ORAL_TABLET | Freq: Two times a day (BID) | ORAL | 0 refills | Status: DC

## 2023-01-05 MED ORDER — QSYMIA 3.75-23 MG PO CP24: 1.0000 | ORAL_CAPSULE | Freq: Every day | ORAL | 0 refills | Status: DC

## 2023-01-05 MED ORDER — AZELASTINE HCL 0.1 % NA SOLN: 2.0000 | Freq: Two times a day (BID) | NASAL | 12 refills | Status: DC

## 2023-01-05 NOTE — Patient Instructions (Signed)
It was very nice to see you today!  Please start the Astelin nasal spray and doxycycline.  Use the cough medication as needed.  Let us know if not proving in a few weeks.  We will start Qsymia to help with weight loss.  Please send me a message in a few weeks to let me know how this is doing.  Return if symptoms worsen or fail to improve.   Take care, Dr Jimmey Ralph  PLEASE NOTE:  If you had any lab tests, please let us know if you have not heard back within a few days. You may see your results on mychart before we have a chance to review them but we will give you a call once they are reviewed by [place].   If we ordered any referrals today, please let us know if you have not heard from their office within the next week.   If you had any urgent prescriptions sent in today, please check with the pharmacy within an hour of our visit to make sure the prescription was transmitted appropriately.   Please try these tips to maintain a healthy lifestyle:  Eat at least 3 REAL meals and 1-2 snacks per day.  Aim for no more than 5 hours between eating.  If you eat breakfast, please do so within one hour of getting up.   Each meal should contain half fruits/vegetables, one quarter protein, and one quarter carbs (no bigger than a computer mouse)  Cut down on sweet beverages. This includes juice, soda, and sweet tea.   Drink at least 1 glass of water with each meal and aim for at least 8 glasses per day  Exercise at least 150 minutes every week.

## 2023-01-05 NOTE — Assessment & Plan Note (Signed)
BMI today 35 with comorbidities.  She has been on phentermine intermittently in the past however has had some issues with side effects with the higher dose of 37.5.  Does not feel like lower doses are effective.  She is interested in trying different medication to help with weight loss.  She is not interested in GLP-1 agonist at this time due to concern for potential side effects.  We will start low-dose Qsymia.  We discussed potential side effects.  She will follow-up with me in a few weeks and we can titrate the dose as tolerated.  She will need to come back in 3 months for in person office evaluation.

## 2023-01-05 NOTE — Progress Notes (Signed)
   Donna Chandler is a 67 y.o. female who presents today for an office visit.  Assessment/Plan:  New/Acute Problems: Sinusitis  No red flags.  No signs of systemic illness.  Given that symptoms have been going on for several weeks would be reasonable to try alternative antibiotics at this point.  She does have a penicillin allergy and has had a few courses of azithromycin already.  We will start doxycycline.  Will also start Astelin nasal spray.  Encouraged hydration.  She can use Tessalon as needed for cough.  She will let us know if not improving in a few weeks.  Will consider referral to ENT at that time.  Chronic Problems Addressed Today: Obesity BMI today 35 with comorbidities.  She has been on phentermine intermittently in the past however has had some issues with side effects with the higher dose of 37.5.  Does not feel like lower doses are effective.  She is interested in trying different medication to help with weight loss.  She is not interested in GLP-1 agonist at this time due to concern for potential side effects.  We will start low-dose Qsymia.  We discussed potential side effects.  She will follow-up with me in a few weeks and we can titrate the dose as tolerated.  She will need to come back in 3 months for in person office evaluation.  Dyslipidemia She will come back soon for CPE.  Recheck lipids at that time.     Subjective:  HPI:  See A/P for status of chronic conditions.  Patient is here today for cough.  This has been going on for several weeks.  She has had 2 rounds of azithromycin over the last several weeks.  These have only given temporary relief.  Cough has been persistent. She is worried about sinus drainage as her symptoms are mostly occurring at night. No fevers or chills. No shortness of breath. No known sick contacts. Tried OTC cough meds with some improvement. No facial pain or pressure.        Objective:  Physical Exam: BP 134/84   Pulse 80   Temp  97.8 F (36.6 C) (Temporal)   Resp 16   Ht 5\' 8"  (1.727 m)   Wt 232 lb 3.2 oz (105.3 kg)   SpO2 100%   BMI 35.31 kg/m   Gen: No acute distress, resting comfortably HEENT: TMs with clear effusion.  Nose mucosa erythematous and boggy bilaterally. CV: Regular rate and rhythm with no murmurs appreciated Pulm: Normal work of breathing, clear to auscultation bilaterally with no crackles, wheezes, or rhonchi Neuro: Grossly normal, moves all extremities Psych: Normal affect and thought content      Samik Balkcom M. Jimmey Ralph, MD 01/05/2023 12:27 PM

## 2023-01-05 NOTE — Assessment & Plan Note (Signed)
She will come back soon for CPE.  Recheck lipids at that time.

## 2023-01-08 ENCOUNTER — Telehealth: Payer: Self-pay | Admitting: Family Medicine

## 2023-01-08 NOTE — Telephone Encounter (Signed)
Patient states: -Pharmacy informed her that they aren't able to fill the Qsymia until they receive more info from PCP  - Pharmacy stated they faxed over request on 4/29  Patient is requesting this information be given so she can get the medication.

## 2023-01-12 NOTE — Telephone Encounter (Signed)
Pt is calling back about the previous message. Please advise.

## 2023-01-13 ENCOUNTER — Other Ambulatory Visit (HOSPITAL_COMMUNITY): Payer: Self-pay

## 2023-01-13 ENCOUNTER — Telehealth: Payer: Self-pay

## 2023-01-13 NOTE — Telephone Encounter (Signed)
Spoke with pharmacy, Rx need PA

## 2023-01-13 NOTE — Telephone Encounter (Signed)
Patient need PA Rx Qsymia

## 2023-01-13 NOTE — Telephone Encounter (Signed)
Pharmacy Patient Advocate Encounter   Received notification that prior authorization for Qsymia 3.75-23mg  is required/requested.   PA submitted on 01/13/23 to (ins) [employer] via [employer]eds [employer] or [facility]) confirmation # [id number] Status is pending

## 2023-01-14 NOTE — Telephone Encounter (Signed)
Rx Qsymia denied Patient notified, advise to call insurance for alternatives

## 2023-01-14 NOTE — Telephone Encounter (Signed)
Pharmacy Patient Advocate Encounter  Received notification from [employer] that the request for prior authorization for Qsymia has been denied due to you did not meet the following requirements: The requested medication and/or diagnosis are not a covered benefit and excluded from coverage in accordance with the terms and conditions of your plan benefit. Therefore, the request has been administratively denied.   Please be advised we currently do not have a Pharmacist to review denials, therefore you will need to process appeals accordingly as needed. Thanks for your support at this time.   You may fax [phone], to appeal.

## 2023-02-02 IMAGING — CR DG CHEST 2V
2 series · 2 of 2 positions shown · non-contrast
Comparison: 12/20/2020

CLINICAL DATA: Shortness of breath

EXAM:
CHEST - 2 VIEW

[chest pa]
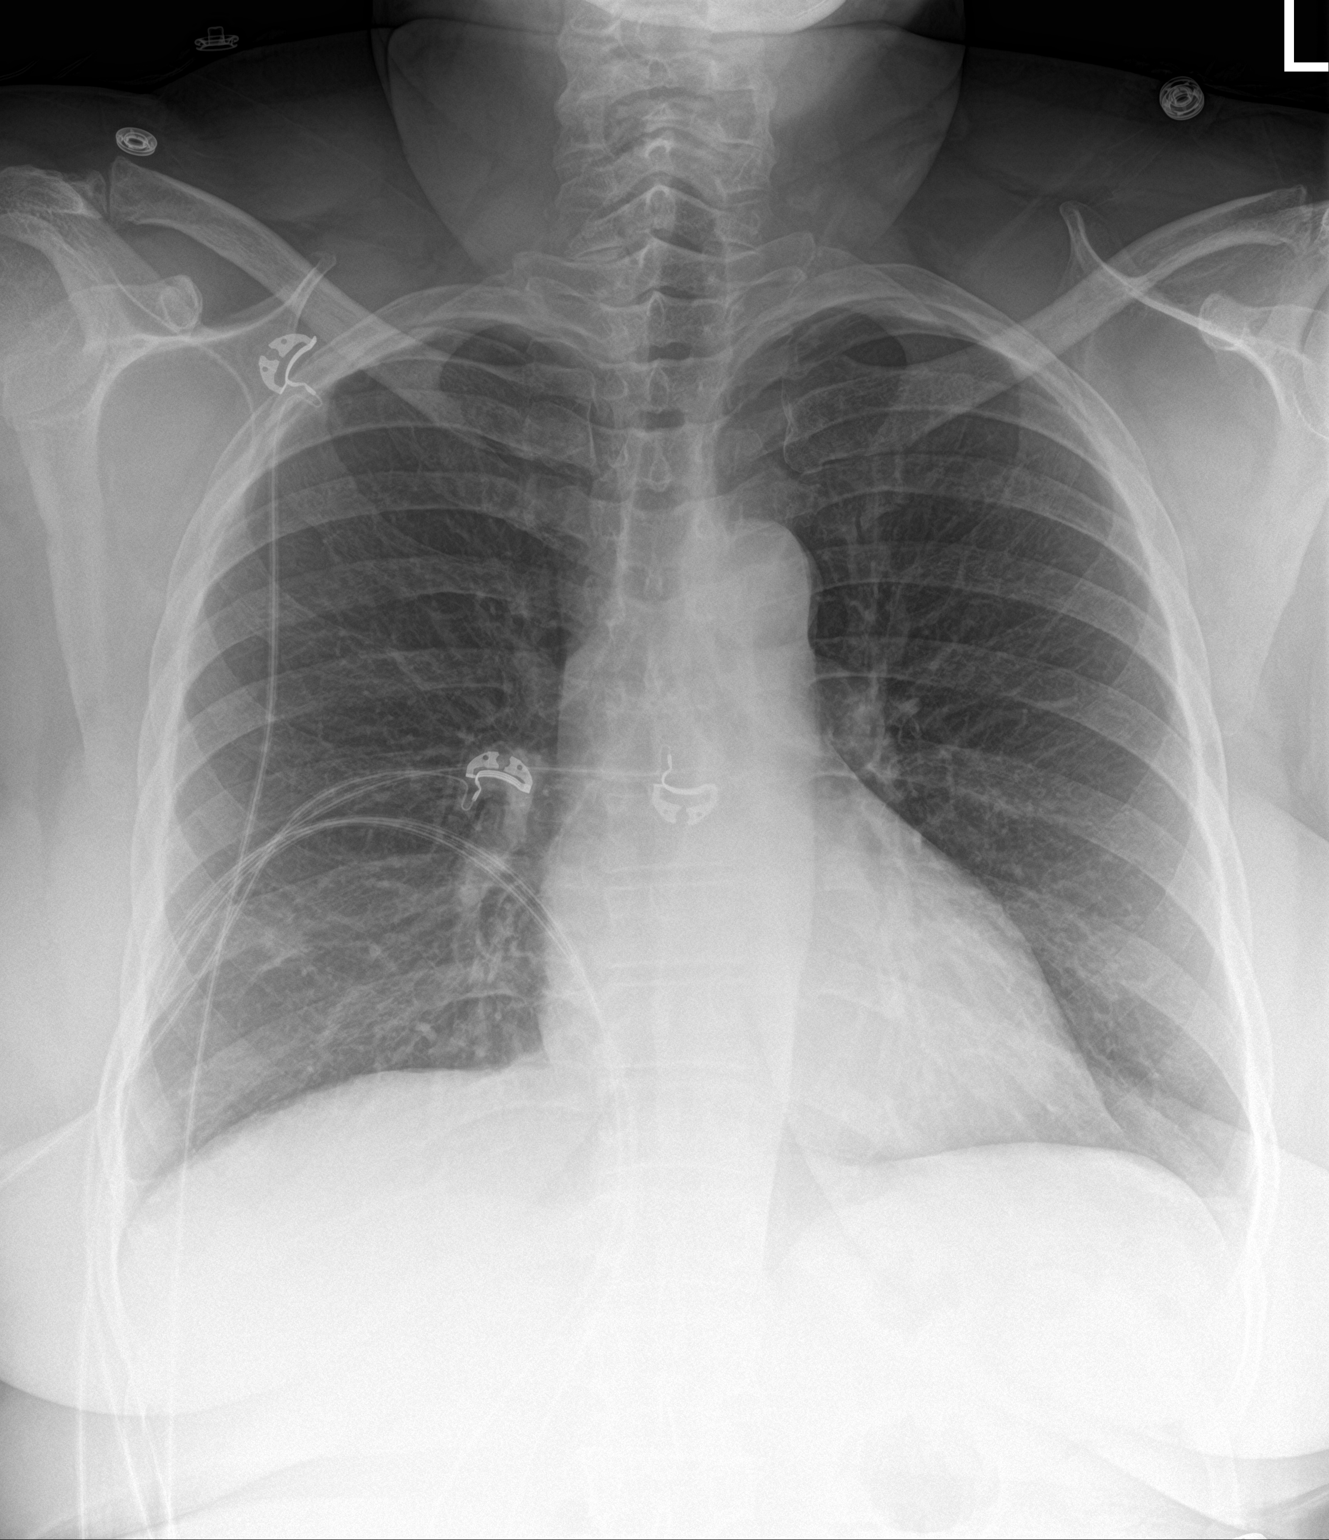

[chest lat]
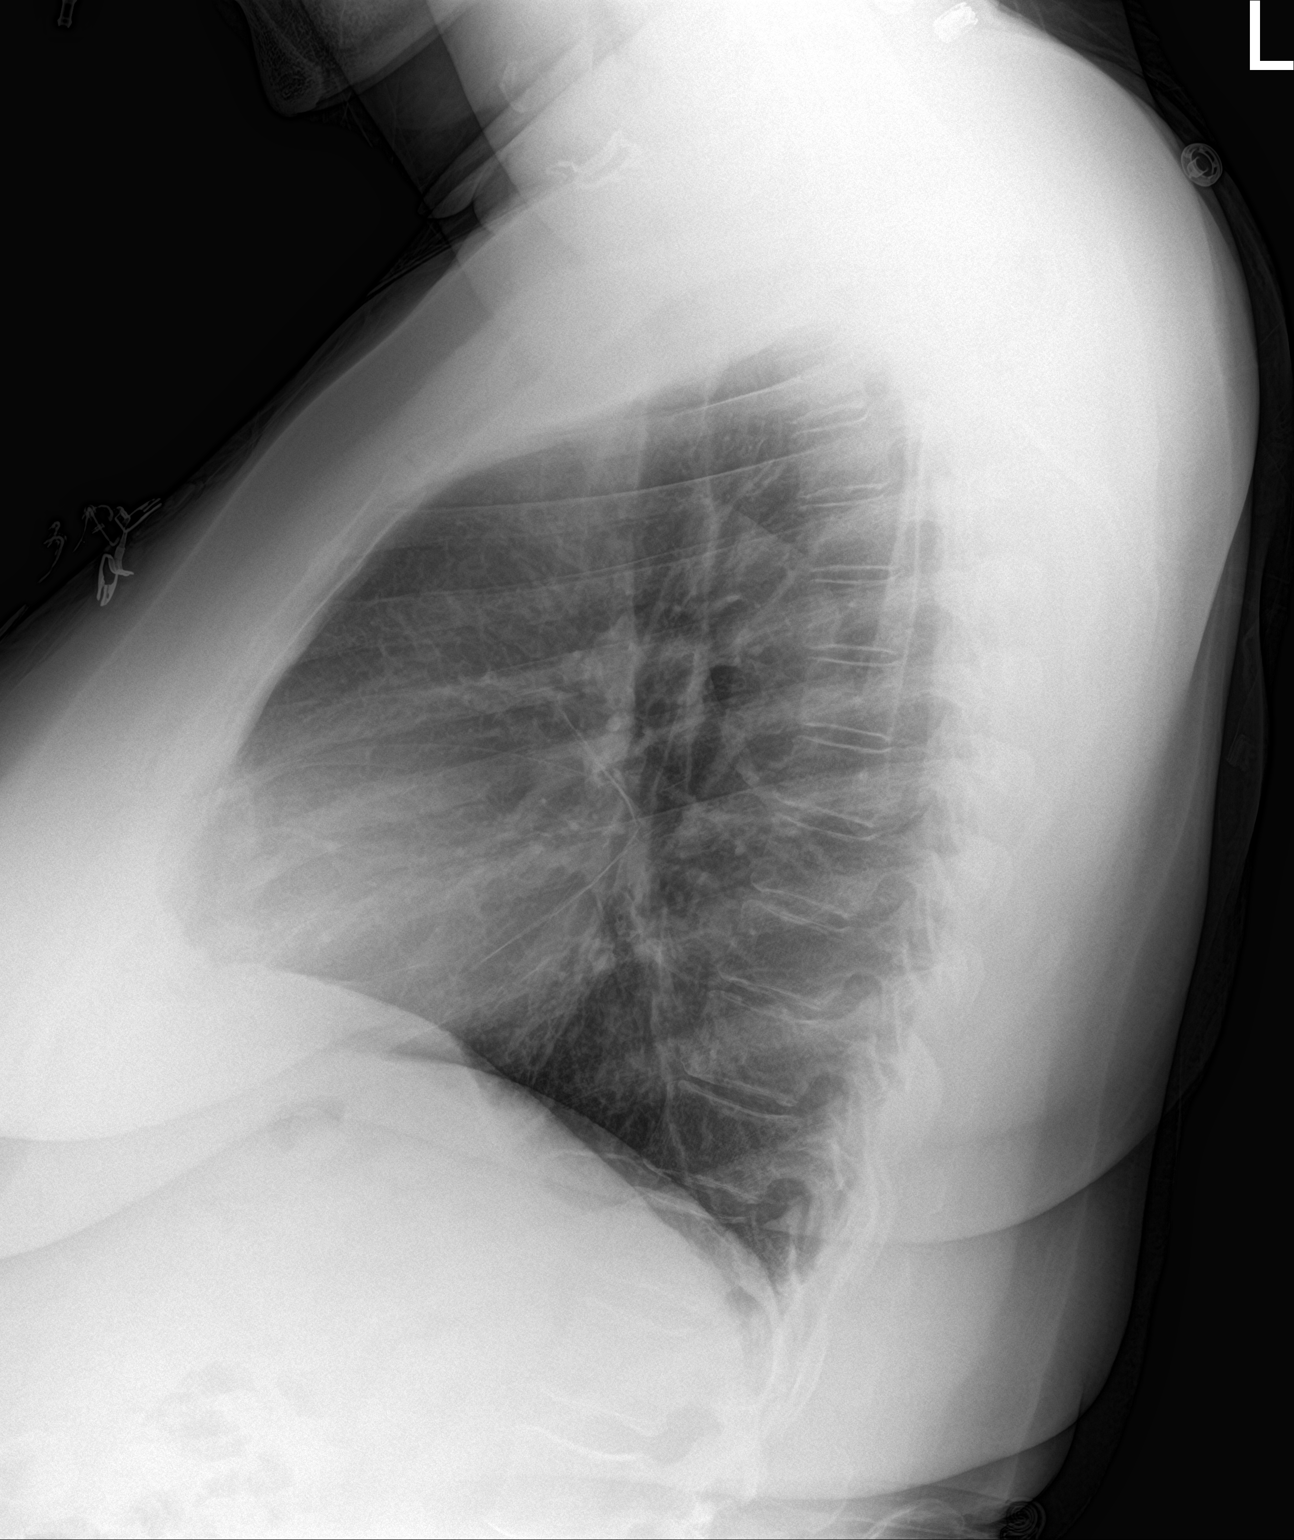

[2 of 2 positions shown; findings below may reference images not displayed]

FINDINGS: Persisting 13 mm nodular density at the right base. No edema,
effusion, or pneumothorax. Normal heart size.
IMPRESSION: Persisting nodular density at the right base, recommend chest CT.

## 2023-02-10 ENCOUNTER — Telehealth: Payer: Self-pay | Admitting: Family Medicine

## 2023-02-10 NOTE — Telephone Encounter (Signed)
Dr Jimmey Ralph patient wanted me to let you know she is doing much better.  Gabriel Cirri [facility] AWV TEAM Direct Dial [phone]

## 2023-03-16 ENCOUNTER — Encounter: Payer: Self-pay | Admitting: Physician Assistant

## 2023-03-16 ENCOUNTER — Ambulatory Visit (INDEPENDENT_AMBULATORY_CARE_PROVIDER_SITE_OTHER): Payer: 59 | Admitting: Physician Assistant

## 2023-03-16 VITALS — BP 126/70 | HR 76 | Temp 97.7°F | Ht 68.0 in | Wt 231.0 lb

## 2023-03-16 DIAGNOSIS — H5789 Other specified disorders of eye and adnexa: Secondary | ICD-10-CM

## 2023-03-16 MED ORDER — POLYMYXIN B-TRIMETHOPRIM 10000-0.1 UNIT/ML-% OP SOLN: 1.0000 [drp] | OPHTHALMIC | 0 refills | Status: DC

## 2023-03-16 NOTE — Progress Notes (Signed)
Donna Chandler is a 67 y.o. female here for a new problem.  History of Present Illness:   Chief Complaint  Patient presents with   Eye Problem    Pt c/o right eye itching, waking up eye watery and white drainage, started 3-4 days ago.   Right eye watering and discharge: She complains of water and white discharge from her right eye.  She denies swelling in either eyes.  Her symptoms worsen in the morning and improve towards the night.  Her symptoms have worsened her vision but finds her vision returns to normal as the day progresses when it clears up.  She has tried applying saline lubricant in her eyes and found no change in her symptoms.  She typically applies saline lubricant regularly to her eyes prior to her symptoms.    Denies any additional upper respiratory infection (URI) symptom(s)   Past Medical History:  Diagnosis Date   Graves disease    Hypertension    Scleritis      Social History   Tobacco Use   Smoking status: Never   Smokeless tobacco: Never  Substance Use Topics   Alcohol use: Never   Drug use: Never    Past Surgical History:  Procedure Laterality Date   ABDOMINAL HYSTERECTOMY      Family History  Problem Relation Age of Onset   Heart disease Mother    Cancer Father    Cancer Sister 27       colon cancer    Cancer Brother 96       pancreatic cancer    Cerebral aneurysm Son    Heart disease Maternal Grandmother    Cancer Maternal Grandfather    Heart disease Paternal Grandmother     Allergies  Allergen Reactions   Morphine     Other reaction(s): Other (See Comments) Other Reaction: OTHER REACTION    Amlodipine Itching   Benazepril Itching   Penicillins     Other reaction(s): Unknown   Cephalexin Rash    Current Medications:   Current Outpatient Medications:    Butalbital-APAP-Caffeine 50-300-40 MG CAPS, Take 1-2 tablets as needed every 6 hours. Do not exceed 6 tablets in 24 hours., Disp: 90 capsule, Rfl: 5    hydrochlorothiazide (HYDRODIURIL) 25 MG tablet, Take 25 mg by mouth daily., Disp: , Rfl:    levothyroxine (SYNTHROID) 100 MCG tablet, TAKE 1 TABLET BY MOUTH EVERY DAY, Disp: 90 tablet, Rfl: 1   losartan (COZAAR) 50 MG tablet, TAKE 1 TABLET BY MOUTH EVERY DAY, Disp: 90 tablet, Rfl: 1   meloxicam (MOBIC) 15 MG tablet, Take 15 mg by mouth daily as needed., Disp: , Rfl:    trimethoprim-polymyxin b (POLYTRIM) ophthalmic solution, Place 1 drop into the right eye every 4 (four) hours., Disp: 10 mL, Rfl: 0   Phentermine-Topiramate (QSYMIA) 3.75-23 MG CP24, Take 1 capsule by mouth daily. (Patient not taking: Reported on 03/16/2023), Disp: 30 capsule, Rfl: 0   Review of Systems:   Review of Systems  Eyes:  Positive for blurred vision (right eye) and discharge (white discharge in right eye).       (+)watery right eye (-)swelling in either eyes    Vitals:   Vitals:   03/16/23 1140  BP: 126/70  Pulse: 76  Temp: 97.7 F (36.5 C)  TempSrc: Temporal  SpO2: 97%  Weight: 231 lb (104.8 kg)  Height: 5\' 8"  (1.727 m)     Body mass index is 35.12 kg/m.  Physical Exam:   Physical Exam Vitals  and nursing note reviewed.  Constitutional:      General: She is not in acute distress.    Appearance: She is well-developed. She is not ill-appearing or toxic-appearing.  HENT:     Head: Normocephalic and atraumatic.     Right Ear: Tympanic membrane, ear canal and external ear normal. Tympanic membrane is not erythematous, retracted or bulging.     Left Ear: Tympanic membrane, ear canal and external ear normal. Tympanic membrane is not erythematous, retracted or bulging.     Nose: Nose normal.     Right Sinus: No maxillary sinus tenderness or frontal sinus tenderness.     Left Sinus: No maxillary sinus tenderness or frontal sinus tenderness.     Mouth/Throat:     Pharynx: Uvula midline. No posterior oropharyngeal erythema.  Eyes:     General: Lids are normal.        Right eye: Discharge present.      Conjunctiva/sclera:     Right eye: Right conjunctiva is injected.     Left eye: Left conjunctiva is not injected.  Neck:     Trachea: Trachea normal.  Cardiovascular:     Rate and Rhythm: Normal rate and regular rhythm.     Heart sounds: Normal heart sounds, S1 normal and S2 normal.  Pulmonary:     Effort: Pulmonary effort is normal.     Breath sounds: Normal breath sounds. No decreased breath sounds, wheezing, rhonchi or rales.  Lymphadenopathy:     Cervical: No cervical adenopathy.  Skin:    General: Skin is warm and dry.  Neurological:     Mental Status: She is alert.  Psychiatric:        Speech: Speech normal.        Behavior: Behavior normal. Behavior is cooperative.     Assessment and Plan:   Eye discharge No red flags on exam Recommend topical antibiotic(s) - polytrim has been ordered for patient If new/worsening symptom(s), needs to closely follow-up with [place] -- she does have an ophthalmologist - discussed that if symptom(s) persist, we may need to plug her back in with them  [employer] as a scribe for [employer], PA.,have documented all relevant documentation on the behalf of Jarold Motto, PA,as directed by  Jarold Motto, PA while in the presence of Jarold Motto, [place].  I, Jarold Motto, [place], have reviewed all documentation for this visit. The documentation on 03/16/23 for the exam, diagnosis, procedures, and orders are all accurate and complete.   Jarold Motto, PA-C

## 2023-04-09 ENCOUNTER — Other Ambulatory Visit: Payer: Self-pay | Admitting: Family Medicine

## 2023-04-09 DIAGNOSIS — E039 Hypothyroidism, unspecified: Secondary | ICD-10-CM

## 2023-04-21 ENCOUNTER — Ambulatory Visit: Payer: 59 | Admitting: Physician Assistant

## 2023-04-23 ENCOUNTER — Ambulatory Visit (INDEPENDENT_AMBULATORY_CARE_PROVIDER_SITE_OTHER): Payer: 59 | Admitting: Physician Assistant

## 2023-04-23 ENCOUNTER — Ambulatory Visit (INDEPENDENT_AMBULATORY_CARE_PROVIDER_SITE_OTHER)
Admission: RE | Admit: 2023-04-23 | Discharge: 2023-04-23 | Disposition: A | Discharge status: Home / Self Care | Payer: 59 | Source: Ambulatory Visit | Attending: Physician Assistant | Admitting: Physician Assistant

## 2023-04-23 ENCOUNTER — Encounter: Payer: Self-pay | Admitting: Physician Assistant

## 2023-04-23 VITALS — BP 118/80 | HR 84 | Temp 97.1°F | Ht 68.0 in | Wt 234.0 lb

## 2023-04-23 DIAGNOSIS — M79672 Pain in left foot: Secondary | ICD-10-CM

## 2023-04-23 NOTE — Patient Instructions (Signed)
It was great to see you!  An order for xray has been put in for you. To have this done, you can walk in at the [facility] location without a scheduled appointment.  The address is [address] It is across the street from [facility]. [facility] is located in the basement.   Hours of operation are M-F 8:30am to 5:00pm.  Please note that they are closed for lunch between 12:30 and 1:00pm.  Try to wear more supportive shoes and prop feet up while working for prolonged periods of time  Take care,  Jarold Motto PA-C

## 2023-04-23 NOTE — Progress Notes (Signed)
Donna Chandler is a 67 y.o. female here for a new problem.  History of Present Illness:   Chief Complaint  Patient presents with   Foot Pain    Pt c/o left foot pain x several weeks, outer foot area and swells up by end of day, hurts mainly when walking on it.  Has taken Motrin with relief.     HPI  Foot Pain (Left): Complains of left foot pain that began a couple of weeks ago. Reports pain is exaggerated more so by exertion and outer area of foot is swollen by end of the day and pain diffuse dorsally.  Noticed a nodule on left outer side that swells alongside with regular swelling from exertion.  States gait is not affected until pain worsens. Has taken Motrin with relief.  Describes working from home and is usually barefoot though she does also wear footwear similar to compression socks.  Denies change in footwear or activity level.    Past Medical History:  Diagnosis Date   Graves disease    Hypertension    Scleritis      Social History   Tobacco Use   Smoking status: Never   Smokeless tobacco: Never  Substance Use Topics   Alcohol use: Never   Drug use: Never    Past Surgical History:  Procedure Laterality Date   ABDOMINAL HYSTERECTOMY      Family History  Problem Relation Age of Onset   Heart disease Mother    Cancer Father    Cancer Sister 25       colon cancer    Cancer Brother 28       pancreatic cancer    Cerebral aneurysm Son    Heart disease Maternal Grandmother    Cancer Maternal Grandfather    Heart disease Paternal Grandmother     Allergies  Allergen Reactions   Morphine     Other reaction(s): Other (See Comments) Other Reaction: OTHER REACTION    Amlodipine Itching   Benazepril Itching   Penicillins     Other reaction(s): Unknown   Cephalexin Rash    Current Medications:   Current Outpatient Medications:    Azelastine HCl 137 MCG/SPRAY SOLN, Place 2 sprays into both nostrils 2 (two) times daily., Disp: , Rfl:     Butalbital-APAP-Caffeine 50-300-40 MG CAPS, Take 1-2 tablets as needed every 6 hours. Do not exceed 6 tablets in 24 hours., Disp: 90 capsule, Rfl: 5   hydrochlorothiazide (HYDRODIURIL) 25 MG tablet, Take 25 mg by mouth daily., Disp: , Rfl:    levothyroxine (SYNTHROID) 100 MCG tablet, TAKE 1 TABLET BY MOUTH EVERY DAY, Disp: 90 tablet, Rfl: 1   losartan (COZAAR) 50 MG tablet, TAKE 1 TABLET BY MOUTH EVERY DAY, Disp: 90 tablet, Rfl: 1   meloxicam (MOBIC) 15 MG tablet, Take 15 mg by mouth daily as needed., Disp: , Rfl:    Review of Systems:   Review of Systems  Musculoskeletal:        (+) Foot pain (left, see HPI)  Skin:        (+) Nodule (left side of left foot, see HPI)    Vitals:   Vitals:   04/23/23 1002  BP: 118/80  Pulse: 84  Temp: (!) 97.1 F (36.2 C)  TempSrc: Temporal  SpO2: 99%  Weight: 234 lb (106.1 kg)  Height: 5\' 8"  (1.727 m)     Body mass index is 35.58 kg/m.  Physical Exam:   Physical Exam Constitutional:  Appearance: Normal appearance. She is well-developed.  HENT:     Head: Normocephalic and atraumatic.  Eyes:     General: Lids are normal.     Extraocular Movements: Extraocular movements intact.     Conjunctiva/sclera: Conjunctivae normal.  Pulmonary:     Effort: Pulmonary effort is normal.  Musculoskeletal:        General: Normal range of motion.     Cervical back: Normal range of motion and neck supple.  Feet:     Comments: Tenderness to palpation with deep palpation to left foot at the base of the 5th metatarsal Normal ROM of foot and no obvious deformities Skin:    General: Skin is warm and dry.  Neurological:     Mental Status: She is alert and oriented to person, place, and time.  Psychiatric:        Attention and Perception: Attention and perception normal.        Mood and Affect: Mood normal.        Behavior: Behavior normal.        Thought Content: Thought content normal.        Judgment: Judgment normal.     Assessment and Plan:    Left foot pain Will obtain xray to rule out fracture Discussed importance of adequate shoe wear with support If no obvious fractures - will refer to physical therapy If fractures - will send to orthopedics as indicated  I,Emily Lagle,acting as a scribe for [employer], PA.,have documented all relevant documentation on the behalf of Jarold Motto, PA,as directed by  Jarold Motto, PA while in the presence of Jarold Motto, [place].  I, Jarold Motto, [place], have reviewed all documentation for this visit. The documentation on 04/23/23 for the exam, diagnosis, procedures, and orders are all accurate and complete.  Jarold Motto, PA-C

## 2023-04-30 ENCOUNTER — Telehealth: Payer: Self-pay | Admitting: Family Medicine

## 2023-04-30 NOTE — Telephone Encounter (Signed)
Pt states she needs a referral to an Orthopedic doctor. Please advise.

## 2023-05-01 ENCOUNTER — Other Ambulatory Visit: Payer: Self-pay

## 2023-05-01 DIAGNOSIS — M79672 Pain in left foot: Secondary | ICD-10-CM

## 2023-05-01 NOTE — Telephone Encounter (Signed)
We need more information before we can place a referral about what's going on.  Alternatively, her insurance may of the require a referral and she can call to schedule an appointment herself.  Katina Degree. Jimmey Ralph, MD 05/01/2023 9:17 AM

## 2023-05-01 NOTE — Telephone Encounter (Signed)
Ok to place referral.  Donna Chandler. Jimmey Ralph, MD 05/01/2023 9:48 AM

## 2023-05-13 ENCOUNTER — Ambulatory Visit: Payer: 59 | Admitting: Family

## 2023-05-14 ENCOUNTER — Ambulatory Visit: Payer: 59 | Admitting: Physician Assistant

## 2023-06-09 ENCOUNTER — Other Ambulatory Visit: Payer: Self-pay | Admitting: Family Medicine

## 2023-06-09 DIAGNOSIS — I10 Essential (primary) hypertension: Secondary | ICD-10-CM

## 2023-06-30 ENCOUNTER — Other Ambulatory Visit: Payer: Self-pay | Admitting: *Deleted

## 2023-06-30 MED ORDER — LOSARTAN POTASSIUM 50 MG PO TABS: 50.0000 mg | ORAL_TABLET | Freq: Every day | ORAL | 1 refills | Status: DC

## 2023-07-02 ENCOUNTER — Ambulatory Visit: Payer: 59 | Admitting: Family

## 2023-07-03 ENCOUNTER — Other Ambulatory Visit: Payer: Self-pay | Admitting: Family Medicine

## 2023-07-03 ENCOUNTER — Other Ambulatory Visit: Payer: Self-pay | Admitting: Family

## 2023-07-03 DIAGNOSIS — G43809 Other migraine, not intractable, without status migrainosus: Secondary | ICD-10-CM

## 2023-09-15 ENCOUNTER — Telehealth: Payer: Self-pay

## 2023-09-15 NOTE — Telephone Encounter (Signed)
 Copied from CRM [id number]. Topic: Clinical - Medication Question >> Sep 15, 2023  8:29 AM Pinkey ORN wrote: Reason for CRM: Antibiotic Call In Request >> Sep 15, 2023  8:32 AM Pinkey ORN wrote: Patient is requesting the provider to call in a Z-pak into the pharmacy, patient believes she may have some sort of virus.   Message has been sent to provider to address

## 2023-09-15 NOTE — Telephone Encounter (Signed)
Please schedule an office visit  thanks

## 2023-09-15 NOTE — Telephone Encounter (Signed)
 Recommend appointment.  Katina Degree. Jimmey Ralph, MD 09/15/2023 8:47 AM

## 2023-09-16 ENCOUNTER — Telehealth: Payer: 59 | Admitting: Family Medicine

## 2023-09-16 DIAGNOSIS — J069 Acute upper respiratory infection, unspecified: Secondary | ICD-10-CM | POA: Diagnosis not present

## 2023-09-16 MED ORDER — PROMETHAZINE-DM 6.25-15 MG/5ML PO SYRP: 5.0000 mL | ORAL_SOLUTION | Freq: Three times a day (TID) | ORAL | 0 refills | Status: DC | PRN

## 2023-09-16 NOTE — Patient Instructions (Signed)
  Reganne Marzec, thank you for joining Chiquita CHRISTELLA Barefoot, NP for today's virtual visit.  While this provider is not your primary care provider (PCP), if your PCP is located in our provider database this encounter information will be shared with them immediately following your visit.   A [facility] [place] MyChart account gives you access to today's visit and all your visits, tests, and labs performed at [facility]  click here if you don't have a [place] [facility] MyChart account or go to [email or web address]  Consent: (Patient) Teighan Levandowski provided verbal consent for this virtual visit at the beginning of the encounter.  Current Medications:  Current Outpatient Medications:    promethazine -dextromethorphan (PROMETHAZINE -DM) 6.25-15 MG/5ML syrup, Take 5 mLs by mouth 3 (three) times daily as needed for cough., Disp: 118 mL, Rfl: 0   Azelastine  HCl 137 MCG/SPRAY SOLN, Place 2 sprays into both nostrils 2 (two) times daily., Disp: , Rfl:    Butalbital -APAP-Caffeine  50-300-40 MG CAPS, TAKE 1-2 TABLETS AS NEEDED EVERY 6 HOURS. DO NOT EXCEED 6 TABLETS IN 24 HOURS., Disp: 90 capsule, Rfl: 5   hydrochlorothiazide  (HYDRODIURIL ) 25 MG tablet, TAKE 1 TABLET BY MOUTH EVERY DAY, Disp: 90 tablet, Rfl: 1   levothyroxine  (SYNTHROID ) 100 MCG tablet, TAKE 1 TABLET BY MOUTH EVERY DAY, Disp: 90 tablet, Rfl: 1   losartan  (COZAAR ) 50 MG tablet, Take 1 tablet (50 mg total) by mouth daily., Disp: 90 tablet, Rfl: 1   meloxicam (MOBIC) 15 MG tablet, Take 15 mg by mouth daily as needed., Disp: , Rfl:    Medications ordered in this encounter:  Meds ordered this encounter  Medications   promethazine -dextromethorphan (PROMETHAZINE -DM) 6.25-15 MG/5ML syrup    Sig: Take 5 mLs by mouth 3 (three) times daily as needed for cough.    Dispense:  118 mL    Refill:  0    Supervising Provider:   BLAISE ALEENE KIDD [[id number]]     *If you need refills on other medications prior to your next appointment, please  contact your pharmacy*  Follow-Up: Call back or seek an in-person evaluation if the symptoms worsen or if the condition fails to improve as anticipated.  [facility] [phone]  Other Instructions  URI recommendations: - Increased rest - Increasing Fluids - Acetaminophen  / ibuprofen as needed for fever/pain.  - Salt water gargling, chloraseptic spray and throat lozenges - Mucinex if mucus is present and increasing.  - Saline nasal spray if congestion or if nasal passages feel dry. - Humidifying the air.     If you have been instructed to have an in-person evaluation today at a local Urgent Care facility, please use the link below. It will take you to a list of all of our available [facility], including address, phone number and hours of operation. Please do not delay care.  [facility]  If you or a family member do not have a primary care provider, use the link below to schedule a visit and establish care. When you choose a [facility] primary care physician or advanced practice provider, you gain a long-term partner in health. Find a Primary Care Provider  Learn more about [facility]'s in-office and virtual care options: [place] [facility] - Get Care Now

## 2023-09-16 NOTE — Progress Notes (Signed)
 Virtual Visit Consent   Donna Chandler, you are scheduled for a virtual visit with a  provider today. Just as with appointments in the office, your consent must be obtained to participate. Your consent will be active for this visit and any virtual visit you may have with one of our providers in the next 365 days. If you have a MyChart account, a copy of this consent can be sent to you electronically.  As this is a virtual visit, video technology does not allow for your provider to perform a traditional examination. This may limit your provider's ability to fully assess your condition. If your provider identifies any concerns that need to be evaluated in person or the need to arrange testing (such as labs, EKG, etc.), we will make arrangements to do so. Although advances in technology are sophisticated, we cannot ensure that it will always work on either your end or our end. If the connection with a video visit is poor, the visit may have to be switched to a telephone visit. With either a video or telephone visit, we are not always able to ensure that we have a secure connection.  By engaging in this virtual visit, you consent to the provision of healthcare and authorize for your insurance to be billed (if applicable) for the services provided during this visit. Depending on your insurance coverage, you may receive a charge related to this service.  I need to obtain your verbal consent now. Are you willing to proceed with your visit today? Donna Chandler has provided verbal consent on 09/16/2023 for a virtual visit (video or telephone). Chiquita CHRISTELLA Barefoot, NP  Date: 09/16/2023 9:33 AM  Virtual Visit via Video Note   I, Chiquita CHRISTELLA Barefoot, connected with  Donna Chandler  ([id number], July 11, 1956) on 09/16/23 at  9:30 AM EST by a video-enabled telemedicine application and verified that I am speaking with the correct person using two identifiers.  Location: Patient: Virtual Visit Location  Patient: Home Provider: Virtual Visit Location Provider: Home Office   I discussed the limitations of evaluation and management by telemedicine and the availability of in person appointments. The patient expressed understanding and agreed to proceed.    History of Present Illness: Donna Chandler is a 68 y.o. who identifies as a female who was assigned female at birth, and is being seen today for URI  Onset was 3 days ago with sore throat Associated symptoms are cough, and headache, congestion mostly at night  Modifying factors are chloraseptic cough meds, mucinex (two different ones), zinc, b complex, and vitamin D ,  Denies chest pain, shortness of breath, fevers, chills  Exposure to sick contacts- unknown COVID test: neg  Vaccines: no, declined    Problems:  Patient Active Problem List   Diagnosis Date Noted   Hyperglycemia 05/09/2022   Pulmonary nodule 02/11/2021   Obesity 02/11/2021   Leg cramp 11/30/2020   Headache 11/30/2020   Trigeminal neuralgia 11/30/2020   Dyslipidemia 10/19/2020   Migraines 10/18/2020   Essential hypertension 10/18/2020   Hypothyroidism 10/18/2020   Colon polyps 10/18/2020   Vitamin D  deficiency 10/18/2020    Allergies:  Allergies  Allergen Reactions   Morphine     Other reaction(s): Other (See Comments) Other Reaction: OTHER REACTION    Amlodipine  Itching   Benazepril Itching   Penicillins     Other reaction(s): Unknown   Cephalexin Rash   Medications:  Current Outpatient Medications:    Azelastine  HCl 137 MCG/SPRAY SOLN, Place 2 sprays into both  nostrils 2 (two) times daily., Disp: , Rfl:    Butalbital -APAP-Caffeine  50-300-40 MG CAPS, TAKE 1-2 TABLETS AS NEEDED EVERY 6 HOURS. DO NOT EXCEED 6 TABLETS IN 24 HOURS., Disp: 90 capsule, Rfl: 5   hydrochlorothiazide  (HYDRODIURIL ) 25 MG tablet, TAKE 1 TABLET BY MOUTH EVERY DAY, Disp: 90 tablet, Rfl: 1   levothyroxine  (SYNTHROID ) 100 MCG tablet, TAKE 1 TABLET BY MOUTH EVERY DAY, Disp: 90  tablet, Rfl: 1   losartan  (COZAAR ) 50 MG tablet, Take 1 tablet (50 mg total) by mouth daily., Disp: 90 tablet, Rfl: 1   meloxicam (MOBIC) 15 MG tablet, Take 15 mg by mouth daily as needed., Disp: , Rfl:   Observations/Objective: Patient is well-developed, well-nourished in no acute distress.  Resting comfortably  at home.  Head is normocephalic, atraumatic.  No labored breathing.  Speech is clear and coherent with logical content.  Patient is alert and oriented at baseline.    Assessment and Plan:  1. Viral URI with cough (Primary)  - promethazine -dextromethorphan (PROMETHAZINE -DM) 6.25-15 MG/5ML syrup; Take 5 mLs by mouth 3 (three) times daily as needed for cough.  Dispense: 118 mL; Refill: 0  URI recommendations: -Continue OTC- but only use one Mucinex - Increased rest - Increasing Fluids - Acetaminophen  / ibuprofen as needed for fever/pain.  - Salt water gargling, chloraseptic spray and throat lozenges - Mucinex if mucus is present and increasing.  - Saline nasal spray if congestion or if nasal passages feel dry. - Humidifying the air.    Reviewed side effects, risks and benefits of medication.    Patient acknowledged agreement and understanding of the plan.   Past Medical, Surgical, Social History, Allergies, and Medications have been Reviewed.    Follow Up Instructions: I discussed the assessment and treatment plan with the patient. The patient was provided an opportunity to ask questions and all were answered. The patient agreed with the plan and demonstrated an understanding of the instructions.  A copy of instructions were sent to the patient via MyChart unless otherwise noted below.    The patient was advised to call back or seek an in-person evaluation if the symptoms worsen or if the condition fails to improve as anticipated.    Chiquita CHRISTELLA Barefoot, NP

## 2023-10-06 ENCOUNTER — Other Ambulatory Visit: Payer: Self-pay | Admitting: Family Medicine

## 2023-10-06 DIAGNOSIS — E039 Hypothyroidism, unspecified: Secondary | ICD-10-CM

## 2023-10-08 ENCOUNTER — Other Ambulatory Visit: Payer: Self-pay | Admitting: Family Medicine

## 2023-11-15 ENCOUNTER — Encounter (HOSPITAL_COMMUNITY): Payer: Self-pay

## 2023-11-15 ENCOUNTER — Emergency Department (HOSPITAL_COMMUNITY)
Admission: EM | Admit: 2023-11-15 | Discharge: 2023-11-15 | Disposition: A | Discharge status: Home / Self Care | Attending: Emergency Medicine | Admitting: Emergency Medicine

## 2023-11-15 DIAGNOSIS — R030 Elevated blood-pressure reading, without diagnosis of hypertension: Secondary | ICD-10-CM

## 2023-11-15 DIAGNOSIS — R7989 Other specified abnormal findings of blood chemistry: Secondary | ICD-10-CM | POA: Insufficient documentation

## 2023-11-15 DIAGNOSIS — I10 Essential (primary) hypertension: Secondary | ICD-10-CM | POA: Diagnosis present

## 2023-11-15 DIAGNOSIS — R519 Headache, unspecified: Secondary | ICD-10-CM

## 2023-11-15 DIAGNOSIS — Z79899 Other long term (current) drug therapy: Secondary | ICD-10-CM | POA: Insufficient documentation

## 2023-11-15 LAB — BASIC METABOLIC PANEL
Anion gap: 11 (ref 5–15)
BUN: 11 mg/dL (ref 8–23)
CO2: 24 mmol/L (ref 22–32)
Calcium: 9.2 mg/dL (ref 8.9–10.3)
Chloride: 97 mmol/L — ABNORMAL LOW (ref 98–111)
Creatinine, Ser: 1.01 mg/dL — ABNORMAL HIGH (ref 0.44–1.00)
GFR, Estimated: >60 mL/min (ref >60)
Glucose, Bld: 142 mg/dL — ABNORMAL HIGH (ref 70–99)
Potassium: 3.6 mmol/L (ref 3.5–5.1)
Sodium: 132 mmol/L — ABNORMAL LOW (ref 135–145)

## 2023-11-15 LAB — CBC WITH DIFFERENTIAL/PLATELET
Abs Immature Granulocytes: 0.06 10*3/uL (ref 0.00–0.07)
Basophils Absolute: 0.1 10*3/uL (ref 0.0–0.1)
Basophils Relative: 1 %
Eosinophils Absolute: 0.3 10*3/uL (ref 0.0–0.5)
Eosinophils Relative: 4 %
HCT: 39.2 % (ref 36.0–46.0)
Hemoglobin: 12.9 g/dL (ref 12.0–15.0)
Immature Granulocytes: 1 %
Lymphocytes Relative: 43 %
Lymphs Abs: 2.8 10*3/uL (ref 0.7–4.0)
MCH: 29.2 pg (ref 26.0–34.0)
MCHC: 32.9 g/dL (ref 30.0–36.0)
MCV: 88.7 fL (ref 80.0–100.0)
Monocytes Absolute: 0.5 10*3/uL (ref 0.1–1.0)
Monocytes Relative: 8 %
Neutro Abs: 2.7 10*3/uL (ref 1.7–7.7)
Neutrophils Relative %: 43 %
Platelets: 230 10*3/uL (ref 150–400)
RBC: 4.42 MIL/uL (ref 3.87–5.11)
RDW: 13.5 % (ref 11.5–15.5)
WBC: 6.5 10*3/uL (ref 4.0–10.5)
nRBC: 0 % (ref 0.0–0.2)

## 2023-11-15 LAB — TSH: TSH: 15.162 u[IU]/mL — ABNORMAL HIGH (ref 0.350–4.500)

## 2023-11-15 LAB — TROPONIN I (HIGH SENSITIVITY): Troponin I (High Sensitivity): 3 ng/L (ref <18)

## 2023-11-15 LAB — T4, FREE: Free T4: 0.7 ng/dL (ref 0.61–1.12)

## 2023-11-15 NOTE — ED Triage Notes (Signed)
 Pt c/o headache, nausea, and increased BP since last night.  Pain score 7/10.  Pt reports taking additional doses of her HTN medication w/o relief.    Pt reports highest was 188 systolic.   Pt reports taking aspirin x3 prior to arrival.

## 2023-11-15 NOTE — ED Provider Notes (Signed)
 [place] EMERGENCY DEPARTMENT AT [facility] Provider Note   CSN: [id number] Arrival date & time: 11/15/23  4098     History  Chief Complaint  Patient presents with   Headache   Hypertension    Donna Chandler is a 68 y.o. female.  Patient with history of Graves disease on synthroid, hypertension presents today with complaints of headache and hypertension. She states that she developed a headache throughout the day yesterday that was on the top of her head. She states that she does sometimes get headaches when her blood pressure is high so she checked it and noticed it was higher than normal. She states the highest it ever got was 180/100.  This headache feels like headaches she has had in the past for which she is prescribed butalbital.  She has been taking her antihypertensives as prescribed.  When she noticed that her blood pressure was higher, she took another dose of her hydrochlorothiazide, however she states it only came down a few points.  Presents with concern for same.  States her blood pressure is normally well-controlled and she checks it regularly.  She has been taking her Synthroid as prescribed as well.  She does note that her headache is significantly improved and she got here today.  Denies any changes in her vision.  She did feel nauseous briefly but is not nauseous anymore.  Denies chest pain or shortness of breath.  The history is provided by the patient. No language interpreter was used.  Headache Hypertension Associated symptoms include headaches.       Home Medications Prior to Admission medications   Medication Sig Start Date End Date Taking? Authorizing Provider  Azelastine HCl 137 MCG/SPRAY SOLN Place 2 sprays into both nostrils 2 (two) times daily. 04/05/23   [provider]  Butalbital-APAP-Caffeine 50-300-40 MG CAPS TAKE 1-2 TABLETS AS NEEDED EVERY 6 HOURS. DO NOT EXCEED 6 TABLETS IN 24 HOURS. 07/03/23   Ardith Dark, MD   hydrochlorothiazide (HYDRODIURIL) 25 MG tablet TAKE 1 TABLET BY MOUTH EVERY DAY 06/09/23   Ardith Dark, MD  levothyroxine (SYNTHROID) 100 MCG tablet TAKE 1 TABLET BY MOUTH EVERY DAY 10/06/23   Ardith Dark, MD  losartan (COZAAR) 50 MG tablet TAKE 1 TABLET BY MOUTH EVERY DAY 10/08/23   Ardith Dark, MD  meloxicam (MOBIC) 15 MG tablet Take 15 mg by mouth daily as needed. 03/10/23   [provider]  promethazine-dextromethorphan (PROMETHAZINE-DM) 6.25-15 MG/5ML syrup Take 5 mLs by mouth 3 (three) times daily as needed for cough. 09/16/23   Freddy Finner, NP      Allergies    Morphine, Amlodipine, Benazepril, Penicillins, and Cephalexin    Review of Systems   Review of Systems  Neurological:  Positive for headaches.  All other systems reviewed and are negative.   Physical Exam Updated Vital Signs BP (!) 162/99 (BP Location: Right Arm)   Pulse 82   Temp 97.6 F (36.4 C)   Resp 16   Ht 5\' 8"  (1.727 m)   Wt 106.1 kg   SpO2 98%   BMI 35.58 kg/m  Physical Exam Vitals and nursing note reviewed.  Constitutional:      General: She is not in acute distress.    Appearance: Normal appearance. She is normal weight. She is not ill-appearing, toxic-appearing or diaphoretic.  HENT:     Head: Normocephalic and atraumatic.  Eyes:     Extraocular Movements: Extraocular movements intact.     Pupils:  Pupils are equal, round, and reactive to light.  Neck:     Comments: No meningismus Cardiovascular:     Rate and Rhythm: Normal rate and regular rhythm.     Heart sounds: Normal heart sounds.  Pulmonary:     Effort: Pulmonary effort is normal. No respiratory distress.     Breath sounds: Normal breath sounds.  Abdominal:     Palpations: Abdomen is soft.     Tenderness: There is no abdominal tenderness.  Musculoskeletal:        General: Normal range of motion.     Cervical back: Normal range of motion and neck supple.  Skin:    General: Skin is warm and dry.  Neurological:      General: No focal deficit present.     Mental Status: She is alert and oriented to person, place, and time.     GCS: GCS eye subscore is 4. GCS verbal subscore is 5. GCS motor subscore is 6.     Sensory: Sensation is intact.     Motor: Motor function is intact.     Coordination: Coordination is intact.     Gait: Gait is intact.     Comments: Alert and oriented to self, place, time and event.    Speech is fluent, clear without dysarthria or dysphasia.    Strength 5/5 in upper/lower extremities   Sensation intact in upper/lower extremities   Psychiatric:        Mood and Affect: Mood normal.        Behavior: Behavior normal.     ED Results / Procedures / Treatments   Labs (all labs ordered are listed, but only abnormal results are displayed) Labs Reviewed  BASIC METABOLIC PANEL - Abnormal; Notable for the following components:      Result Value   Sodium 132 (*)    Chloride 97 (*)    Glucose, Bld 142 (*)    Creatinine, Ser 1.01 (*)    All other components within normal limits  TSH - Abnormal; Notable for the following components:   TSH 15.162 (*)    All other components within normal limits  CBC WITH DIFFERENTIAL/PLATELET  T4, FREE  T3, FREE  TROPONIN I (HIGH SENSITIVITY)  TROPONIN I (HIGH SENSITIVITY)    EKG None  Radiology No results found.  Procedures Procedures    Medications Ordered in ED Medications - No data to display  ED Course/ Medical Decision Making/ A&P                                 Medical Decision Making  This patient is a 68 y.o. female who presents to the ED for concern of headache, this involves an extensive number of treatment options, and is a complaint that carries with it a high risk of complications and morbidity. The emergent differential diagnosis prior to evaluation includes, but is not limited to,  Emergent considerations for headache include subarachnoid hemorrhage, meningitis, temporal arteritis, glaucoma, cerebral ischemia,  carotid/vertebral dissection, intracranial tumor, Venous sinus thrombosis, carbon monoxide poisoning, acute or chronic subdural hemorrhage.  Other considerations include: Migraine, Cluster headache, Hypertension, Caffeine, alcohol, or drug withdrawal, Arteriovenous malformation, Preeclampsia, Tension headache   This is not an exhaustive differential.   Past Medical History / Co-morbidities / Social History:  has a past medical history of Graves disease, Hypertension, and Scleritis.  Additional history: Chart reviewed.  Physical Exam: Physical exam performed. The pertinent findings  include: No acute physical exam abnormalities.  Patient alert and oriented and neurologically intact without focal deficits  Lab Tests: I ordered, and personally interpreted labs.  The pertinent results include:  Na 132, chloride 97, glucose 142, creatinine 1.01. TSH 15, free T4 WNL  Cardiac Monitoring:  The patient was maintained on a cardiac monitor.  Cardiac monitor showed an underlying rhythm of: sinus rhythm. I agree with this interpretation.   Disposition: After consideration of the diagnostic results and the patients response to treatment, I feel that emergency department workup does not suggest an emergent condition requiring admission or immediate intervention beyond what has been performed at this time. The plan is: discharge with close pcp follow-up and return precautions. Patients blood pressure is currently 150/90. She has no signs or symptoms of hypertensive urgency or emergency. She has no focal neurologic deficits and her headache has improved on its own without intervention. I did CT imaging of the head given patient is greater than 67 years old and has a headache and never has had imaging before that I can see.  She declines this and wants to go home.  I did offer a headache cocktail as well which she also declined.  She does have an elevated TSH, however she has no signs or symptoms to suggest  myxedema coma or thyroid storm.  She is compliant with her Synthroid.  States this has happened before and she had to go up on her Synthroid.  Recommend that she reach out to her PCP to facilitate this.  This could be contributing to her hypertension.  Recommend that she checked her blood pressure at home regularly, document her readings, and bring them to her PCP to discuss additional medication management of her blood pressure as well. Evaluation and diagnostic testing in the emergency department does not suggest an emergent condition requiring admission or immediate intervention beyond what has been performed at this time.  Plan for discharge with close PCP follow-up.  Patient is understanding and amenable with plan, educated on red flag symptoms that would prompt immediate return.  Patient discharged in stable condition.  Findings and plan of care discussed with supervising physician Dr. Lynelle Doctor who is in agreement.   Final Clinical Impression(s) / ED Diagnoses Final diagnoses:  Elevated blood pressure reading  Elevated TSH  Acute nonintractable headache, unspecified headache type    Rx / DC Orders ED Discharge Orders     None     An After Visit Summary was printed and given to the patient.     Vear Clock 11/15/23 1334    Linwood Dibbles, MD 11/16/23 [phone]

## 2023-11-15 NOTE — Discharge Instructions (Signed)
 As we discussed, your labs today did show that you have a elevated TSH.  This likely means you need to increase your dose of Synthroid that you are taking.  I recommend that you call your primary care provider tomorrow morning to discuss this as well as a close follow-up appointment.  This could be contributing to your elevated blood pressure.  I recommend that you take your blood pressure medication as prescribed every day and document your readings regularly to bring to your primary provider at your next appointment.  If you have any more headaches, please take the butalbital that you have been prescribed for management of your headaches.  Return if development of any new or worsening symptoms.

## 2023-11-17 ENCOUNTER — Telehealth: Payer: Self-pay

## 2023-11-17 ENCOUNTER — Encounter: Payer: Self-pay | Admitting: Family Medicine

## 2023-11-17 ENCOUNTER — Ambulatory Visit (INDEPENDENT_AMBULATORY_CARE_PROVIDER_SITE_OTHER): Admitting: Family Medicine

## 2023-11-17 VITALS — BP 138/86 | HR 82 | Temp 97.3°F | Ht 68.0 in | Wt 244.2 lb

## 2023-11-17 DIAGNOSIS — E669 Obesity, unspecified: Secondary | ICD-10-CM | POA: Diagnosis not present

## 2023-11-17 DIAGNOSIS — G43809 Other migraine, not intractable, without status migrainosus: Secondary | ICD-10-CM | POA: Diagnosis not present

## 2023-11-17 DIAGNOSIS — I10 Essential (primary) hypertension: Secondary | ICD-10-CM | POA: Diagnosis not present

## 2023-11-17 DIAGNOSIS — E039 Hypothyroidism, unspecified: Secondary | ICD-10-CM | POA: Diagnosis not present

## 2023-11-17 MED ORDER — QSYMIA 3.75-23 MG PO CP24: 1.0000 | ORAL_CAPSULE | Freq: Every day | ORAL | 0 refills | Status: DC

## 2023-11-17 MED ORDER — LEVOTHYROXINE SODIUM 150 MCG PO TABS: 150.0000 ug | ORAL_TABLET | Freq: Every day | ORAL | 3 refills | Status: DC

## 2023-11-17 NOTE — Progress Notes (Signed)
   Donna Chandler is a 68 y.o. female who presents today for an office visit.  Assessment/Plan:  New/Acute Problems: Hyponatremia Sodium 132 in the ED.  May be related to her uncontrolled hypothyroidism.  We did discuss rechecking today however she would like to hold off on this for now.  Headache has resolved.  No numbness or tingling.  Will recheck at her next office visit.  Chronic Problems Addressed Today: Hypothyroidism TSH not at goal in the ED.  This is likely the main contributor for her recent issues with headache, weight gain, and elevated blood pressure reading.  Will increase her Synthroid to 150 mcg daily.  She will come back in 4 weeks to recheck.  Essential hypertension Blood pressure at goal today on losartan 50 mg daily and HCTZ 25 mg daily.  Likely elevated recently due to her hypothyroidism.  She will continue to monitor at home and let us know if persistently elevated.  Recheck again in a month at her next follow-up visit.  Migraines She has not had any recurrence the last couple of days now that her blood pressure is under better control.  As above this is likely due to her uncontrolled hypothyroidism.  She has Fioricet to use as needed.  She will let us know if she has any recurrence between now and her next visit.     Subjective:  HPI:  See A/P for status of chronic conditions.  Patient is here for ED follow-up.  She went to the ED 2 days ago with headache and hypertension.  Her blood pressure at home was 180/100.  In the ED she had labs which were notable for hyponatremia and elevated creatinine.  Blood pressure in the ED was 150/90.  They had discussed checking a CT scan at that time however she declined.  She was discharged home.  Her symptoms have been improving the last couple of days.  She has been taking extra half dose of her thyroid pill the last couple of days.  She does feel like this has significantly improved her symptoms.  Her home readings of blood  pressure have been at goal.  Headache has resolved.       Objective:  Physical Exam: BP 138/86   Pulse 82   Temp (!) 97.3 F (36.3 C)   Ht 5\' 8"  (1.727 m)   Wt 244 lb 3.2 oz (110.8 kg)   SpO2 98%   BMI 37.13 kg/m   Gen: No acute distress, resting comfortably CV: Regular rate and rhythm with no murmurs appreciated Pulm: Normal work of breathing, clear to auscultation bilaterally with no crackles, wheezes, or rhonchi Neuro: Grossly normal, moves all extremities Psych: Normal affect and thought content  Time Spent: 40 minutes of total time was spent on the date of the encounter performing the following actions: chart review prior to seeing the patient including recent Emergency Department visit, obtaining history, performing a medically necessary exam, counseling on the treatment plan, placing orders, and documenting in our EHR.        Katina Degree. Jimmey Ralph, MD 11/17/2023 12:10 PM

## 2023-11-17 NOTE — Assessment & Plan Note (Addendum)
 She has not had any recurrence the last couple of days now that her blood pressure is under better control.  As above this is likely due to her uncontrolled hypothyroidism.  She has Fioricet to use as needed.  She will let us know if she has any recurrence between now and her next visit.

## 2023-11-17 NOTE — Assessment & Plan Note (Signed)
 Blood pressure at goal today on losartan 50 mg daily and HCTZ 25 mg daily.  Likely elevated recently due to her hypothyroidism.  She will continue to monitor at home and let us know if persistently elevated.  Recheck again in a month at her next follow-up visit.

## 2023-11-17 NOTE — Patient Instructions (Signed)
 It was very nice to see you today!  I am glad that you are feeling better.  Your blood pressure is at goal today.  We will increase your Synthroid to daily.   I will send in Qsymia.  Will see back in a few weeks.  Please come back sooner if needed.  Return in about 4 weeks (around 12/15/2023).   Take care, Dr Jimmey Ralph  PLEASE NOTE:  If you had any lab tests, please let us know if you have not heard back within a few days. You may see your results on mychart before we have a chance to review them but we will give you a call once they are reviewed by [place].   If we ordered any referrals today, please let us know if you have not heard from their office within the next week.   If you had any urgent prescriptions sent in today, please check with the pharmacy within an hour of our visit to make sure the prescription was transmitted appropriately.   Please try these tips to maintain a healthy lifestyle:  Eat at least 3 REAL meals and 1-2 snacks per day.  Aim for no more than 5 hours between eating.  If you eat breakfast, please do so within one hour of getting up.   Each meal should contain half fruits/vegetables, one quarter protein, and one quarter carbs (no bigger than a computer mouse)  Cut down on sweet beverages. This includes juice, soda, and sweet tea.   Drink at least 1 glass of water with each meal and aim for at least 8 glasses per day  Exercise at least 150 minutes every week.

## 2023-11-17 NOTE — Assessment & Plan Note (Signed)
 TSH not at goal in the ED.  This is likely the main contributor for her recent issues with headache, weight gain, and elevated blood pressure reading.  Will increase her Synthroid to 150 mcg daily.  She will come back in 4 weeks to recheck.

## 2023-11-17 NOTE — Telephone Encounter (Unsigned)
 Copied from CRM [id number]. Topic: Clinical - Prescription Issue >> Nov 17, 2023  1:32 PM [name] wrote: Reason for CRM: Pt called to advise the pharmacy does not have the medication prescribed Phentermine-Topiramate (QSYMIA) 3.75-23 MG CP24. Plt is requesting the provider send the prescription to [facility] on [address]. Please call pt back regarding this matter.[phone]  Have updated pharmacy - Sent to Dr Jimmey Ralph to resend

## 2023-11-18 ENCOUNTER — Telehealth: Payer: Self-pay

## 2023-11-18 ENCOUNTER — Other Ambulatory Visit: Payer: Self-pay

## 2023-11-18 MED ORDER — QSYMIA 3.75-23 MG PO CP24: 1.0000 | ORAL_CAPSULE | Freq: Every day | ORAL | 0 refills | Status: DC

## 2023-11-18 NOTE — Telephone Encounter (Signed)
 Copied from CRM [id number]. Topic: Clinical - Prescription Issue >> Nov 18, 2023 12:23 PM [name] wrote: Reason for CRM: Patient states her pharmacy told her she will need Dr. Jimmey Ralph to submit a prior authorization to her insurance for levothyroxine (SYNTHROID) 150 MCG tablet

## 2023-11-18 NOTE — Telephone Encounter (Signed)
 Reason for CRM: Patient states her pharmacy told her she will need Dr. Jimmey Ralph to submit a prior authorization to her insurance for levothyroxine (SYNTHROID) 150 MCG tablet   #90 tablet - sent to Prior A team

## 2023-11-18 NOTE — Telephone Encounter (Signed)
 Donna Chandler

## 2023-11-19 ENCOUNTER — Telehealth: Payer: Self-pay

## 2023-11-19 ENCOUNTER — Other Ambulatory Visit (HOSPITAL_COMMUNITY): Payer: Self-pay

## 2023-11-19 NOTE — Telephone Encounter (Signed)
 Please start a PA thanks

## 2023-11-19 NOTE — Telephone Encounter (Signed)
 Pharmacy Patient Advocate Encounter   Received notification from Pt Calls Messages that prior authorization for Levothyroxine 150 is required/requested.   Insurance verification completed.   The patient is insured through  [employer]  .   Pl[facility] to [facility] [address]. Pt co-pay is $0.00 with no prior auth required.

## 2023-12-16 ENCOUNTER — Other Ambulatory Visit: Payer: Self-pay | Admitting: *Deleted

## 2023-12-16 DIAGNOSIS — I10 Essential (primary) hypertension: Secondary | ICD-10-CM

## 2023-12-16 MED ORDER — HYDROCHLOROTHIAZIDE 25 MG PO TABS: 25.0000 mg | ORAL_TABLET | Freq: Every day | ORAL | 1 refills | Status: DC

## 2023-12-17 ENCOUNTER — Ambulatory Visit: Admitting: Family Medicine

## 2023-12-21 ENCOUNTER — Encounter: Payer: Self-pay | Admitting: Family

## 2023-12-21 ENCOUNTER — Ambulatory Visit (INDEPENDENT_AMBULATORY_CARE_PROVIDER_SITE_OTHER): Admitting: Family

## 2023-12-21 VITALS — BP 117/80 | HR 93 | Temp 97.3°F | Ht 68.0 in | Wt 237.4 lb

## 2023-12-21 DIAGNOSIS — E039 Hypothyroidism, unspecified: Secondary | ICD-10-CM | POA: Diagnosis not present

## 2023-12-21 NOTE — Progress Notes (Signed)
 Patient ID: Donna Chandler, female    DOB: 05-Dec-1955, 68 y.o.   MRN: [id number]  Chief Complaint  Patient presents with   Hyperthyroidism  Discussed the use of AI scribe software for clinical note transcription with the patient, who gave verbal consent to proceed.  History of Present Illness The patient, with a history of hypertension and hypothyroidism, presents for follow-up after a recent ER visit where she was found to have an elevated TSH. She was experiencing high blood pressure and extreme fatigue, particularly in the afternoons. Following the ER visit, she met with her PCP and her levothyroxine was increased from to . Since the increase, she reports feeling better overall but has been experiencing heart palpitations. She has tried skipping the levothyroxine for a couple of days, which seemed to help with the palpitations. She also reports taking biotin, a supplement known to affect TSH levels.  Assessment & Plan Hypothyroidism Pt reports going to the ER on 3/9 for very high BP, lab work done then indicated elevated TSH and low-normal free T4 indicated hypothyroidism. Pt seen by PCP on 3/11 and levothyroxine dose increased from 100 to qd which pt states thinks led to palpitations, she states she did not take her dose yesterday, but did take it this morning. Pt made aware to hold her Biotin for 2 days in the future prior to lab draws as may affect TSH levels. She states she did not take this today. - Order TSH and free T4 tests to reassess thyroid function. - Advise avoiding biotin before testing in future. - Discuss levothyroxine dose adjustment based on results.  Hypertension Blood pressure well-controlled on current medication regimen. - Continue losartan 50 mg and hydrochlorothiazide 25mg  qam. - Monitor blood pressure at home regularly & notify office of any elevations >135/90. - F/U with PCP as planned.      Subjective:    Outpatient Medications Prior  to Visit  Medication Sig Dispense Refill   Butalbital-APAP-Caffeine 50-300-40 MG CAPS TAKE 1-2 TABLETS AS NEEDED EVERY 6 HOURS. DO NOT EXCEED 6 TABLETS IN 24 HOURS. 90 capsule 5   hydrochlorothiazide (HYDRODIURIL) 25 MG tablet Take 1 tablet (25 mg total) by mouth daily. 90 tablet 1   levothyroxine (SYNTHROID) 150 MCG tablet Take 1 tablet (150 mcg total) by mouth daily. 90 tablet 3   losartan (COZAAR) 50 MG tablet TAKE 1 TABLET BY MOUTH EVERY DAY 90 tablet 1   meloxicam (MOBIC) 15 MG tablet Take 15 mg by mouth daily as needed.     Phentermine-Topiramate (QSYMIA) 3.75-23 MG CP24 Take 1 capsule by mouth daily. 30 capsule 0   Azelastine HCl 137 MCG/SPRAY SOLN Place 2 sprays into both nostrils 2 (two) times daily. (Patient not taking: Reported on 11/17/2023)     promethazine-dextromethorphan (PROMETHAZINE-DM) 6.25-15 MG/5ML syrup Take 5 mLs by mouth 3 (three) times daily as needed for cough. (Patient not taking: Reported on 11/17/2023) 118 mL 0   No facility-administered medications prior to visit.   Past Medical History:  Diagnosis Date   Graves disease    Hypertension    Scleritis    Past Surgical History:  Procedure Laterality Date   ABDOMINAL HYSTERECTOMY     Allergies  Allergen Reactions   Morphine     Other reaction(s): Other (See Comments) Other Reaction: OTHER REACTION    Amlodipine Itching   Benazepril Itching   Penicillins     Other reaction(s): Unknown   Cephalexin Rash      Objective:  Physical Exam Vitals and nursing note reviewed.  Constitutional:      Appearance: Normal appearance. She is obese.  Cardiovascular:     Rate and Rhythm: Normal rate and regular rhythm.  Pulmonary:     Effort: Pulmonary effort is normal.     Breath sounds: Normal breath sounds.  Musculoskeletal:        General: Normal range of motion.  Skin:    General: Skin is warm and dry.  Neurological:     Mental Status: She is alert.  Psychiatric:        Mood and Affect: Mood normal.         Behavior: Behavior normal.    BP 117/80 (BP Location: Left Arm, Patient Position: Sitting, Cuff Size: Large)   Pulse 93   Temp (!) 97.3 F (36.3 C) (Temporal)   Ht 5\' 8"  (1.727 m)   Wt 237 lb 6.4 oz (107.7 kg)   SpO2 97%   BMI 36.10 kg/m  Wt Readings from Last 3 Encounters:  12/21/23 237 lb 6.4 oz (107.7 kg)  11/17/23 244 lb 3.2 oz (110.8 kg)  11/15/23 234 lb (106.1 kg)      Versa Gore, NP

## 2023-12-22 ENCOUNTER — Encounter: Payer: Self-pay | Admitting: Family

## 2023-12-22 DIAGNOSIS — E039 Hypothyroidism, unspecified: Secondary | ICD-10-CM

## 2023-12-22 LAB — TSH: TSH: 0.18 u[IU]/mL — ABNORMAL LOW (ref 0.35–5.50)

## 2023-12-22 LAB — T4, FREE: Free T4: 1.13 ng/dL (ref 0.60–1.60)

## 2023-12-23 ENCOUNTER — Other Ambulatory Visit: Payer: Self-pay

## 2023-12-23 DIAGNOSIS — E039 Hypothyroidism, unspecified: Secondary | ICD-10-CM

## 2023-12-23 MED ORDER — LEVOTHYROXINE SODIUM 137 MCG PO TABS: 137.0000 ug | ORAL_TABLET | Freq: Every day | ORAL | 1 refills | Status: DC

## 2023-12-23 NOTE — Telephone Encounter (Signed)
 Patient agreeable and requesting prescription. Ok to send in new dose of levothyroxine?

## 2023-12-23 NOTE — Telephone Encounter (Signed)
 Yes please

## 2024-03-09 ENCOUNTER — Other Ambulatory Visit: Payer: Self-pay | Admitting: Family Medicine

## 2024-03-09 DIAGNOSIS — G43809 Other migraine, not intractable, without status migrainosus: Secondary | ICD-10-CM

## 2024-03-10 ENCOUNTER — Other Ambulatory Visit: Payer: Self-pay | Admitting: Family Medicine

## 2024-03-10 ENCOUNTER — Ambulatory Visit (INDEPENDENT_AMBULATORY_CARE_PROVIDER_SITE_OTHER)

## 2024-03-10 VITALS — Ht 68.0 in | Wt 237.0 lb

## 2024-03-10 DIAGNOSIS — Z0000 Encounter for general adult medical examination without abnormal findings: Secondary | ICD-10-CM | POA: Diagnosis not present

## 2024-03-10 DIAGNOSIS — G43809 Other migraine, not intractable, without status migrainosus: Secondary | ICD-10-CM

## 2024-03-10 DIAGNOSIS — Z1211 Encounter for screening for malignant neoplasm of colon: Secondary | ICD-10-CM

## 2024-03-10 NOTE — Telephone Encounter (Signed)
 Copied from CRM [id number]. Topic: Clinical - Medication Refill >> Mar 10, 2024  4:47 PM Berneda F wrote: Medication:  Butalbital -APAP-Caffeine  50-300-40 MG CAPS   Please note this medication has 5 refills but is showing expired. We need to get it refilled so she can pick it up please.  Has the patient contacted their pharmacy? Yes (Agent: If no, request that the patient contact the pharmacy for the refill. If patient does not wish to contact the pharmacy document the reason why and proceed with request.) (Agent: If yes, when and what did the pharmacy advise?)  This is the patient's preferred pharmacy:  [id number] - [place], [place] - [address] AT [address] [place] [address] Phone: [phone] Fax: [phone]  Is this the correct pharmacy for this prescription? Yes If no, delete pharmacy and type the correct one.   Has the prescription been filled recently? No  Is the patient out of the medication? Yes  Has the patient been seen for an appointment in the last year OR does the patient have an upcoming appointment? Yes  Can we respond through MyChart? Yes  Agent: Please be advised that Rx refills may take up to 3 business days. We ask that you follow-up with your pharmacy.

## 2024-03-10 NOTE — Patient Instructions (Signed)
 Donna Chandler , Thank you for taking time out of your busy schedule to complete your Annual Wellness Visit with me. I enjoyed our conversation and look forward to speaking with you again next year. I, as well as your care team,  appreciate your ongoing commitment to your health goals. Please review the following plan we discussed and let me know if I can assist you in the future. Your Game plan/ To Do List    Referrals: If you haven't heard from the office you've been referred to, please reach out to them at the phone provided.  [facility],  [address] [place] [address], [place], [place] [address], [phone].  Please call to get scheduled for a colonoscopy. Follow up Visits: Next Medicare AWV with our clinical staff: N/A   Have you seen your provider in the last 6 months (3 months if uncontrolled diabetes)? Yes Next Office Visit with your provider: Patient stated that she usually calls the office to schedule her visits with Dr. Kennyth.  Patient is aware to call and get scheduled by September of 2025.  Her las office visit was 12/17/23.  Clinician Recommendations:  Aim for 30 minutes of exercise or brisk walking, 6-8 glasses of water, and 5 servings of fruits and vegetables each day.       This is a list of the screening recommended for you and due dates:  Health Maintenance  Topic Date Due   Medicare Annual Wellness Visit  Never done   Pneumococcal Vaccine for age over 95 (1 of 1 - PCV) 03/15/2024*   Zoster (Shingles) Vaccine (1 of 2) 03/21/2024*   Mammogram  12/20/2024*   DEXA scan (bone density measurement)  12/20/2024*   Hepatitis C Screening  12/20/2024*   Flu Shot  04/08/2024   Colon Cancer Screening  09/08/2029   DTaP/Tdap/Td vaccine (4 - Td or Tdap) 11/09/2029   Hepatitis B Vaccine  Aged Out   HPV Vaccine  Aged Out   Meningitis B Vaccine  Aged Out   COVID-19 Vaccine  Discontinued  *Topic was postponed. The date shown is not the original due date.    Advanced  directives: (Declined) Advance directive discussed with you today. Even though you declined this today, please call our office should you change your mind, and we can give you the proper paperwork for you to fill out. Advance Care Planning is important because it:  [x]  Makes sure you receive the medical care that is consistent with your values, goals, and preferences  [x]  It provides guidance to your family and loved ones and reduces their decisional burden about whether or not they are making the right decisions based on your wishes.  Follow the link provided in your after visit summary or read over the paperwork we have mailed to you to help you started getting your Advance Directives in place. If you need assistance in completing these, please reach out to us  so that we can help you!  See attachments for Preventive Care and Fall Prevention Tips.

## 2024-03-10 NOTE — Progress Notes (Signed)
 Subjective:   Donna Chandler is a 68 y.o. who presents for a Medicare Wellness preventive visit.  As a reminder, Annual Wellness Visits don't include a physical exam, and some assessments may be limited, especially if this visit is performed virtually. We may recommend an in-person follow-up visit with your provider if needed.  Visit Complete: Virtual I connected with  Donna Chandler on 03/10/24 by a audio enabled telemedicine application and verified that I am speaking with the correct person using two identifiers.  Patient Location: Home  Provider Location: Home Office  I discussed the limitations of evaluation and management by telemedicine. The patient expressed understanding and agreed to proceed.  Vital Signs: Because this visit was a virtual/telehealth visit, some criteria may be missing or patient reported. Any vitals not documented were not able to be obtained and vitals that have been documented are patient reported.  VideoDeclined- This patient declined Librarian, academic. Therefore the visit was completed with audio only.  Persons Participating in Visit: Patient.  AWV Questionnaire: No: Patient Medicare AWV questionnaire was not completed prior to this visit.  Cardiac Risk Factors include: advanced age (>108men, >49 women);hypertension;dyslipidemia     Objective:    Today's Vitals   03/10/24 0902  Weight: 237 lb (107.5 kg)  Height: 5' 8 (1.727 m)   Body mass index is 36.04 kg/m.     03/10/2024    9:26 AM 11/15/2023    9:16 AM  Advanced Directives  Does Patient Have a Medical Advance Directive? No No  Would patient like information on creating a medical advance directive?  No - Patient declined    Current Medications (verified) Outpatient Encounter Medications as of 03/10/2024  Medication Sig   Butalbital -APAP-Caffeine  50-300-40 MG CAPS TAKE 1-2 TABLETS AS NEEDED EVERY 6 HOURS. DO NOT EXCEED 6 TABLETS IN 24 HOURS.    hydrochlorothiazide  (HYDRODIURIL ) 25 MG tablet Take 1 tablet (25 mg total) by mouth daily.   levothyroxine  (SYNTHROID ) 137 MCG tablet Take 1 tablet (137 mcg total) by mouth daily before breakfast.   losartan  (COZAAR ) 50 MG tablet TAKE 1 TABLET BY MOUTH EVERY DAY   meloxicam (MOBIC) 15 MG tablet Take 15 mg by mouth daily as needed.   Phentermine -Topiramate (QSYMIA ) 3.75-23 MG CP24 Take 1 capsule by mouth daily.   No facility-administered encounter medications on file as of 03/10/2024.    Allergies (verified) Morphine, Amlodipine , Benazepril, Penicillins, and Cephalexin   History: Past Medical History:  Diagnosis Date   Graves disease    Hypertension    Scleritis    Past Surgical History:  Procedure Laterality Date   ABDOMINAL HYSTERECTOMY     Family History  Problem Relation Age of Onset   Heart disease Mother    Cancer Father    Cancer Sister 37       colon cancer    Cancer Brother 17       pancreatic cancer    Cerebral aneurysm Son    Heart disease Maternal Grandmother    Cancer Maternal Grandfather    Heart disease Paternal Grandmother    Social History   Socioeconomic History   Marital status: Divorced    Spouse name: Not on file   Number of children: Not on file   Years of education: Not on file   Highest education level: Bachelor's degree (e.g., BA, AB, BS)  Occupational History   Occupation: IT support  Tobacco Use   Smoking status: Never   Smokeless tobacco: Never  Vaping Use  Vaping status: Never Used  Substance and Sexual Activity   Alcohol use: Never   Drug use: Never   Sexual activity: Not on file  Other Topics Concern   Not on file  Social History Narrative   Lives alone/2025   Social Drivers of Health   Financial Resource Strain: Low Risk  (03/10/2024)   Overall Financial Resource Strain (CARDIA)    Difficulty of Paying Living Expenses: Not hard at all  Recent Concern: Financial Resource Strain - High Risk (12/16/2023)   Overall Financial  Resource Strain (CARDIA)    Difficulty of Paying Living Expenses: Hard  Food Insecurity: No Food Insecurity (03/10/2024)   Hunger Vital Sign    Worried About Running Out of Food in the Last Year: Never true    Ran Out of Food in the Last Year: Never true  Transportation Needs: No Transportation Needs (03/10/2024)   PRAPARE - Administrator, Civil Service (Medical): No    Lack of Transportation (Non-Medical): No  Physical Activity: Sufficiently Active (03/10/2024)   Exercise Vital Sign    Days of Exercise per Week: 7 days    Minutes of Exercise per Session: 60 min  Recent Concern: Physical Activity - Insufficiently Active (12/16/2023)   Exercise Vital Sign    Days of Exercise per Week: 1 day    Minutes of Exercise per Session: 20 min  Stress: No Stress Concern Present (03/10/2024)   [employer] of Occupational Health - Occupational Stress Questionnaire    Feeling of Stress: Not at all  Social Connections: Socially Isolated (03/10/2024)   Social Connection and Isolation Panel    Frequency of Communication with Friends and Family: More than three times a week    Frequency of Social Gatherings with Friends and Family: Once a week    Attends Religious Services: Never    Database administrator or Organizations: No    Attends Engineer, structural: Never    Marital Status: Divorced    Tobacco Counseling Counseling given: Not Answered    Clinical Intake:  Pre-visit preparation completed: Yes  Pain : No/denies pain     BMI - recorded: 36.04 Nutritional Status: BMI > 30  Obese Nutritional Risks: None Diabetes: No  Lab Results  Component Value Date   HGBA1C 7.0 (H) 01/17/2022     How often do you need to have someone help you when you read instructions, pamphlets, or other written materials from your doctor or pharmacy?: 1 - Never  Interpreter Needed?: No  Information entered by :: Tyyonna Soucy, RMA   Activities of Daily Living     03/10/2024    9:23  AM  In your present state of health, do you have any difficulty performing the following activities:  Hearing? 0  Vision? 0  Difficulty concentrating or making decisions? 0  Walking or climbing stairs? 0  Dressing or bathing? 0  Doing errands, shopping? 0  Preparing Food and eating ? N  Using the Toilet? N  In the past six months, have you accidently leaked urine? N  Do you have problems with loss of bowel control? N  Managing your Medications? N  Managing your Finances? N  Housekeeping or managing your Housekeeping? N    Patient Care Team: Kennyth Worth HERO, MD as PCP - General (Family Medicine)  I have updated your Care Teams any recent Medical Services you may have received from other providers in the past year.     Assessment:   This is  a routine wellness examination for Donna Chandler.  Hearing/Vision screen Hearing Screening - Comments:: Denies hearing difficulties   Vision Screening - Comments:: Denies vision issues.  [facility]    Goals Addressed               This Visit's Progress     Weight (lb) < 200 lb (90.7 kg) (pt-stated)   237 lb (107.5 kg)     Would like to lose about 30 Lbs/2025       Depression Screen     03/10/2024    9:28 AM 04/23/2023   10:01 AM 05/09/2022   10:01 AM 01/17/2022    1:29 PM 10/18/2020    1:34 PM  PHQ 2/9 Scores  PHQ - 2 Score 0 0 0 0 0  PHQ- 9 Score 0        Fall Risk     03/10/2024    9:26 AM 04/23/2023   10:08 AM 05/09/2022   10:01 AM  Fall Risk   Falls in the past year? 0 0 0  Number falls in past yr: 0 0 0  Injury with Fall? 0 0 0  Risk for fall due to :   No Fall Risks  Follow up Falls evaluation completed;Falls prevention discussed Falls evaluation completed     MEDICARE RISK AT HOME:  Medicare Risk at Home Any stairs in or around the home?: Yes If so, are there any without handrails?: No Home free of loose throw rugs in walkways, pet beds, electrical cords, etc?: Yes Adequate lighting in your home to  reduce risk of falls?: Yes Life alert?: No Use of a cane, walker or w/c?: No Grab bars in the bathroom?: Yes Shower chair or bench in shower?: No Elevated toilet seat or a handicapped toilet?: No  TIMED UP AND GO:  Was the test performed?  No  Cognitive Function: Declined/Normal: No cognitive concerns noted by patient or family. Patient alert, oriented, able to answer questions appropriately and recall recent events. No signs of memory loss or confusion.        Immunizations Immunization History  Administered Date(s) Administered   Td 09/09/2019   Tdap 11/10/2019   Tetanus 09/09/2019    Screening Tests Health Maintenance  Topic Date Due   Medicare Annual Wellness (AWV)  Never done   Pneumococcal Vaccine: 50+ Years (1 of 1 - PCV) 03/15/2024 (Originally 11/11/2006)   Zoster Vaccines- Shingrix (1 of 2) 03/21/2024 (Originally 11/11/2006)   MAMMOGRAM  12/20/2024 (Originally 09/08/2020)   DEXA SCAN  12/20/2024 (Originally 11/10/2021)   Hepatitis C Screening  12/20/2024 (Originally 11/11/1973)   INFLUENZA VACCINE  04/08/2024   Colonoscopy  09/08/2029   DTaP/Tdap/Td (4 - Td or Tdap) 11/09/2029   Hepatitis B Vaccines  Aged Out   HPV VACCINES  Aged Out   Meningococcal B Vaccine  Aged Out   COVID-19 Vaccine  Discontinued    Health Maintenance  Health Maintenance Due  Topic Date Due   Medicare Annual Wellness (AWV)  Never done   Health Maintenance Items Addressed: Referral sent to GI for colonoscopy, See Nurse Notes at the end of this note  Additional Screening:  Vision Screening: Recommended annual ophthalmology exams for early detection of glaucoma and other disorders of the eye. Would you like a referral to an eye doctor? No    Dental Screening: Recommended annual dental exams for proper oral hygiene  Community Resource Referral / Chronic Care Management: CRR required this visit?  No   CCM required this  visit?  No   Plan:    I have personally reviewed and noted the  following in the patient's chart:   Medical and social history Use of alcohol, tobacco or illicit drugs  Current medications and supplements including opioid prescriptions. Patient is not currently taking opioid prescriptions. Functional ability and status Nutritional status Physical activity Advanced directives List of other physicians Hospitalizations, surgeries, and ER visits in previous 12 months Vitals Screenings to include cognitive, depression, and falls Referrals and appointments  In addition, I have reviewed and discussed with patient certain preventive protocols, quality metrics, and best practice recommendations. A written personalized care plan for preventive services as well as general preventive health recommendations were provided to patient.   Lakysha Kossman L Naziah Weckerly, CMA   03/10/2024   After Visit Summary: (MyChart) Due to this being a telephonic visit, the after visit summary with patients personalized plan was offered to patient via MyChart   Notes: Patient declines all vaccines today.  She is due for a colonoscopy and order has been placed today.  Patient is aware to call and get scheduled by September of 2025.  Her last office visit was 12/17/23 with Corean.  Patient stated that she usually get her mammogram and DEXA done through her GYN doctor.  She had no other concerns to address today.

## 2024-03-14 ENCOUNTER — Other Ambulatory Visit: Payer: Self-pay | Admitting: *Deleted

## 2024-03-14 DIAGNOSIS — E039 Hypothyroidism, unspecified: Secondary | ICD-10-CM

## 2024-03-14 MED ORDER — LEVOTHYROXINE SODIUM 137 MCG PO TABS: 137.0000 ug | ORAL_TABLET | Freq: Every day | ORAL | 1 refills | Status: DC

## 2024-03-14 MED ORDER — BUTALBITAL-APAP-CAFFEINE 50-300-40 MG PO CAPS: ORAL_CAPSULE | ORAL | 5 refills | Status: AC

## 2024-03-14 NOTE — Telephone Encounter (Signed)
 Copied from CRM [id number]. Topic: Clinical - Medication Question >> Mar 10, 2024  4:03 PM Donna Chandler wrote: Reason for CRM: Patient called in stating that pharmacy is telling her that there are no more refills on medication Butalbital -APAP-Caffeine  50-300-40 MG CAPS.Provider denied refill request,and patient would like to know why?   Patient notified Rx send today to pharmacy  [facility]

## 2024-05-12 ENCOUNTER — Ambulatory Visit (INDEPENDENT_AMBULATORY_CARE_PROVIDER_SITE_OTHER): Admitting: Family Medicine

## 2024-05-12 ENCOUNTER — Encounter: Payer: Self-pay | Admitting: Family Medicine

## 2024-05-12 VITALS — BP 117/80 | HR 76 | Temp 97.3°F | Ht 68.0 in | Wt 215.4 lb

## 2024-05-12 DIAGNOSIS — E669 Obesity, unspecified: Secondary | ICD-10-CM | POA: Diagnosis not present

## 2024-05-12 DIAGNOSIS — N760 Acute vaginitis: Secondary | ICD-10-CM

## 2024-05-12 DIAGNOSIS — I10 Essential (primary) hypertension: Secondary | ICD-10-CM

## 2024-05-12 MED ORDER — FLUCONAZOLE 150 MG PO TABS: 150.0000 mg | ORAL_TABLET | ORAL | 0 refills | Status: DC | PRN

## 2024-05-12 NOTE — Patient Instructions (Signed)
 It was very nice to see you today!  VISIT SUMMARY: Today, you were seen for a yeast infection. We also reviewed your blood pressure, weight loss progress, and blood sugar levels.  YOUR PLAN: YEAST INFECTION (VULVOVAGINAL CANDIDIASIS): You have a mild yeast infection with minimal discharge. -Take Diflucan  as prescribed. If symptoms persist after a few days, take a second dose. -Monitor your symptoms. If there is no improvement by next week, consider self-swabbing.  HIGH BLOOD PRESSURE (ESSENTIAL HYPERTENSION): Your blood pressure is well-controlled with your current medication. -Continue taking hydrochlorothiazide  (HCTZ) as prescribed.  WEIGHT MANAGEMENT (OBESITY): You have lost over 20 pounds through diet and exercise. -Continue your current lifestyle changes, focusing on a low-carb diet and regular physical activity.  PREDIABETES:  -Continue your diet and exercise routine to reduce your A1c levels. -Plan for blood work in November to reassess your A1c levels.  Return if symptoms worsen or fail to improve.   Take care, Dr Kennyth  PLEASE NOTE:  If you had any lab tests, please let us  know if you have not heard back within a few days. You may see your results on mychart before we have a chance to review them but we will give you a call once they are reviewed by us .   If we ordered any referrals today, please let us  know if you have not heard from their office within the next week.   If you had any urgent prescriptions sent in today, please check with the pharmacy within an hour of our visit to make sure the prescription was transmitted appropriately.   Please try these tips to maintain a healthy lifestyle:  Eat at least 3 REAL meals and 1-2 snacks per day.  Aim for no more than 5 hours between eating.  If you eat breakfast, please do so within one hour of getting up.   Each meal should contain half fruits/vegetables, one quarter protein, and one quarter carbs (no bigger than a  computer mouse)  Cut down on sweet beverages. This includes juice, soda, and sweet tea.   Drink at least 1 glass of water with each meal and aim for at least 8 glasses per day  Exercise at least 150 minutes every week.

## 2024-05-12 NOTE — Assessment & Plan Note (Signed)
 Blood pressure at goal today.  She has discontinued the losartan  since our last visit.  She is doing great job with last interventions and has lost over 20 pounds over the last year.  She will continue her HCTZ 25 mg daily.  She will come back in a few months for annual physical.

## 2024-05-12 NOTE — Progress Notes (Signed)
   Donna Chandler is a 68 y.o. female who presents today for an office visit.  Assessment/Plan:  New/Acute Problems: Vaginitis  History is consistent with previous yeast infections.  We did recommend swab today to confirm however she declined.  Will empirically treat with Diflucan  however she will come back if not improving with this.  Chronic Problems Addressed Today: Essential hypertension Blood pressure at goal today.  She has discontinued the losartan  since our last visit.  She is doing great job with last interventions and has lost over 20 pounds over the last year.  She will continue her HCTZ 25 mg daily.  She will come back in a few months for annual physical.  Obesity She is doing great job with lifestyle interventions.  She has lost 20 pounds over the last several months.  She will come back in a couple of months for annual physical with labs.     Subjective:  HPI:  See assessment / plan for status of chronic conditions.   Discussed the use of AI scribe software for clinical note transcription with the patient, who gave verbal consent to proceed.  History of Present Illness Donna Chandler is a 68 year old female who presents with a yeast infection.  She began experiencing symptoms of a yeast infection about a week ago. The symptoms are minor, with occasional discharge that does not always reach her underwear. There is no itching or irritation. She has not taken any recent antibiotics that might have triggered the infection. She has not had a yeast infection before but recalls a past bacterial infection treated with doxycycline .  She is actively managing her health through diet and exercise. She follows a low-carb diet and has lost over 20 pounds since her last visit, now weighing 215 pounds. She attributes her weight loss to lifestyle changes, including portion control and incorporating exercise. She runs in place on carpet to avoid joint pain experienced when running  on concrete. She has stopped one of her blood pressure medications and currently takes hydrochlorothiazide  (HCTZ).  She monitors her blood glucose levels, which have been stable around 6.3 to 6.4. She aims to reduce her A1c below 5.7 by continuing her current lifestyle changes.         Objective:  Physical Exam: BP 117/80   Pulse 76   Temp (!) 97.3 F (36.3 C) (Temporal)   Ht 5' 8 (1.727 m)   Wt 215 lb 6.4 oz (97.7 kg)   SpO2 98%   BMI 32.75 kg/m   Wt Readings from Last 3 Encounters:  05/12/24 215 lb 6.4 oz (97.7 kg)  03/10/24 237 lb (107.5 kg)  12/21/23 237 lb 6.4 oz (107.7 kg)    Gen: No acute distress, resting comfortably GU: Deferred Neuro: Grossly normal, moves all extremities Psych: Normal affect and thought content      Dannel Rafter M. Kennyth, MD 05/12/2024 9:42 AM

## 2024-05-12 NOTE — Assessment & Plan Note (Signed)
 She is doing great job with lifestyle interventions.  She has lost 20 pounds over the last several months.  She will come back in a couple of months for annual physical with labs.

## 2024-05-15 ENCOUNTER — Other Ambulatory Visit: Payer: Self-pay | Admitting: Family Medicine

## 2024-05-15 DIAGNOSIS — I10 Essential (primary) hypertension: Secondary | ICD-10-CM

## 2024-06-12 ENCOUNTER — Other Ambulatory Visit: Payer: Self-pay | Admitting: Family Medicine

## 2024-07-08 ENCOUNTER — Ambulatory Visit: Payer: Self-pay | Admitting: *Deleted

## 2024-07-08 NOTE — Telephone Encounter (Signed)
 FYI Only or Action Required?: Action required by provider: update on patient condition and patient taking additional medication for BP old Rx losartan  50 mg this am with hydrochlorothiazide , please advise .  Patient was last seen in primary care on 05/12/2024 by Kennyth Worth HERO, MD.  Called Nurse Triage reporting Hypertension.  Symptoms began several days ago 2 days ago   Interventions attempted: Prescription medications: HCTZ and Other: old Rx losartan  50 mg this am .  Symptoms are: gradually worsening.  Triage Disposition: See PCP Within 2 Weeks  Patient/caregiver understands and will follow disposition?: Yes              Copied from CRM #[id number]. Topic: Clinical - Red Word Triage >> Jul 08, 2024  9:31 AM Willma SAUNDERS wrote: [facility] that prompted transfer to Nurse Triage: Patient states her BP has been going up, last time was due to her thyroid  149/92, had to take 3 doses of her BP medication yesterday to control as it was higher than what it is today. Also said her eyes are red and had a headache yesterday, not feeling 100%. Reason for Disposition  [1] Systolic BP >= 130 OR Diastolic >= 80 AND [2] taking BP medications  Answer Assessment - Initial Assessment Questions Appt scheduled 07/14/24. Patient reports she needs appt when she is off work. Only wants to see PCP. Recommended UC /ED if sx worsening over weekend. Please advise regarding patient taking extra medication this am hydrochlorothiazide  and old Rx losartan  50 mg to lower BP. Instructed patient to monitor BP and keep log to review with PCP next OV. Patient reports she wants thyroid  checked as well.      1. BLOOD PRESSURE: What is your blood pressure? Did you take at least two measurements 5 minutes apart?     BP 143/90 HR 83 rechecked for 130/87.  2. ONSET: When did you take your blood pressure?     Now  3. HOW: How did you take your blood pressure? (e.g., automatic home BP monitor, visiting nurse)      Automatic BP monitor at home  4. HISTORY: Do you have a history of high blood pressure?     Yes  5. MEDICINES: Are you taking any medicines for blood pressure? Have you missed any doses recently?     Yes and taking extra hydrochlorothiazide  and losartan  50 mg this am 6. OTHER SYMPTOMS: Do you have any symptoms? (e.g., blurred vision, chest pain, difficulty breathing, headache, weakness)     Headache yesterday , did not have headache this am due to taking extra hydrochlorothiazide  and  old Rx losartan  50 mg  . Blurred vision reported. No dizziness. No weakness on either side. No N/T. Not sleeping well. Reports eyes are red.  7. PREGNANCY: Is there any chance you are pregnant? When was your last menstrual period?     na  Protocols used: Blood Pressure - High-A-AH

## 2024-07-12 NOTE — Telephone Encounter (Signed)
 I am fine with her restarting her losartan  if blood pressure is better controlled.  We can discuss more at upcoming visit.

## 2024-07-12 NOTE — Telephone Encounter (Signed)
 FYI Spoke with patient, stated after a good night sleep blood pressure came down. Today is 130/85 Prior to have a few days with out sleep her blood pressure was running elevated 164/99 Advise to monitor BP prior to appt  Patient stated she may cancelled appt

## 2024-07-13 NOTE — Telephone Encounter (Signed)
 Noted. We can discuss more at upcoming meeting.

## 2024-07-14 ENCOUNTER — Ambulatory Visit: Admitting: Family Medicine

## 2024-07-15 ENCOUNTER — Encounter: Payer: Self-pay | Admitting: Family Medicine

## 2024-07-20 ENCOUNTER — Ambulatory Visit: Payer: Self-pay

## 2024-07-20 ENCOUNTER — Encounter: Payer: Self-pay | Admitting: Family

## 2024-07-20 ENCOUNTER — Ambulatory Visit (INDEPENDENT_AMBULATORY_CARE_PROVIDER_SITE_OTHER): Admitting: Family

## 2024-07-20 VITALS — BP 130/88 | HR 78 | Temp 97.5°F | Ht 68.0 in | Wt 225.0 lb

## 2024-07-20 DIAGNOSIS — E669 Obesity, unspecified: Secondary | ICD-10-CM | POA: Diagnosis not present

## 2024-07-20 DIAGNOSIS — J3489 Other specified disorders of nose and nasal sinuses: Secondary | ICD-10-CM | POA: Diagnosis not present

## 2024-07-20 DIAGNOSIS — I10 Essential (primary) hypertension: Secondary | ICD-10-CM

## 2024-07-20 DIAGNOSIS — E039 Hypothyroidism, unspecified: Secondary | ICD-10-CM

## 2024-07-20 DIAGNOSIS — E559 Vitamin D deficiency, unspecified: Secondary | ICD-10-CM

## 2024-07-20 DIAGNOSIS — I499 Cardiac arrhythmia, unspecified: Secondary | ICD-10-CM

## 2024-07-20 LAB — EKG 12-LEAD

## 2024-07-20 LAB — VITAMIN D 25 HYDROXY (VIT D DEFICIENCY, FRACTURES): VITD: 78.96 ng/mL (ref 30.00–100.00)

## 2024-07-20 LAB — T4, FREE: Free T4: 0.92 ng/dL (ref 0.60–1.60)

## 2024-07-20 LAB — TSH: TSH: 0.11 u[IU]/mL — ABNORMAL LOW (ref 0.35–5.50)

## 2024-07-20 NOTE — Telephone Encounter (Signed)
 Noted Patient had an appt today

## 2024-07-20 NOTE — Progress Notes (Signed)
 Patient ID: Donna Chandler, female    DOB: 1955/09/16, 68 y.o.   MRN: [id number]  Chief Complaint  Patient presents with   Heart Flutters   Thyroid  Problem    Feeling some heart flutters for the past few weeks. Feels like it is happening more often. Has experienced this in the past when having issues with her thyroid .    Insect Bite    Also has  bug bite on her nose that is still an issue from weeks ago.   Discussed the use of AI scribe software for clinical note transcription with the patient, who gave verbal consent to proceed.  History of Present Illness Donna Chandler is a 68 year old female with HTN & thyroid  issues who presents with palpitations and fluttering sensations and a persistent nose lesion.  She experiences palpitations and fluttering sensations for a few weeks, occurring at various times but not consistently daily. These symptoms are absent upon waking and before sleep, though she often wakes up feeling her heart beating and has not been sleeping well. The fluttering sometimes starts after taking her thyroid  medication and hydrochlorothiazide  in the morning. She took extra HCTZ last night due to high BP and during the night she woke up with a migraine and took 3 of her Fioricet pills. She denies daily caffeine  intake. Her thyroid  medication was adjusted 7 mos ago from 150 micrograms to 137 micrograms following a previous episode of high blood pressure related to thyroid  issues found in the hospital. Her thyroid  levels have not been checked since that adjustment and she had been doing well with no symptoms. Her blood pressure has been increasing recently. She stopped taking losartan  about a year ago and currently manages her blood pressure with hydrochlorothiazide  alone, taking an extra dose last evening d/t high reading which caused nocturia. She has a persistent lesion on her nose that developed after a bite a few months ago. Initially swollen and itchy, the swelling has  reduced, but the lesion has not improved despite using triamcinolone ointment. She has experienced weight fluctuations, losing weight through exercise but regaining some after stopping due to knee pain. She is managing her weight through diet and exercise, encouraged by her son. Her current medications include hydrochlorothiazide , thyroid  medication, magnesium, vitamin D3, B complex, and B12, which she takes inconsistently. She occasionally takes Butalbital  for headaches. She reports no new stress or anxiety and describes her current life as 'living my best life now.'  Assessment & Plan Palpitations Intermittent palpitations possibly related to thyroid  medication or labile BP. Rising blood pressure may be linked to thyroid  issues. EKG wnl compared to last one done in March, despite extra beats heard on auscultation. - Ordered EKG to assess cardiac rhythm. - Advised taking thyroid  medication on an empty stomach, separate from other medications. - Take Losartan  prn if BP >135/90 either number. - F/U if sx are persisting.  Hypothyroidism  Current dose of 137 mcg levothyroxine  may be causing palpitations and elevated blood pressure, indicating possible dosage imbalance. - Advised taking levothyroxine  on an empty stomach, separate from other medications.  Essential hypertension Blood pressure rising, possibly due to thyroid  issues or weight gain. Denies any increased stress. Prefers minimal medication use, and not wanting to add losartan  back daily. Discussed importance of BP control for decreased damage to organs, at least in the short term until lifestyle modifications kick in. - Continue to check blood pressure at home, at least 3-4 days per week. - Discussed use of losartan  50mg  (  has at home) for acute high blood pressure episodes vs extra HCTZ. - F/U if BP continues to be elevated.  Obesity Weight gain of 10lbs after losing 17lbs last spring. Had to stop exercise due to knee pain. Has resumed  low-carb diet and reduced exercise for weight management and blood pressure control. - Encouraged continuation of low-carb diet and exercise, decreased portion sizes, increased water intake of at least 2 liters of fluids daily. - Discussed alternative exercises easier on knees, such as biking or walking.  Vitamin D  deficiency Currently on 10,000 IU vitamin D3 daily. Discussed max recommended dose of 5,000units, and concern for high dosage and potential side effects. - Check Vitamin D  level today. - Discussed potential risks of high-dose vitamin D  supplementation, and better to take 10K units qod.  Persistent nasal lesion Persistent nasal lesion post-insect bite, unresponsive to triamcinolone ointment. No itching, but swelling persists. - Referred to dermatology for evaluation of nasal lesion with picture attached to chart.. - Advised her to seek dermatology consultation on her own if [facility] appt is delayed, can give us  name of office to send referral.   Subjective:    Outpatient Medications Prior to Visit  Medication Sig Dispense Refill   Butalbital -APAP-Caffeine  50-300-40 MG CAPS Take 1-2 tablets as needed every 6 hours. Do not exceed 6 tablets in 24 hours. (Patient taking differently: as needed. Take 1-2 tablets as needed every 6 hours. Do not exceed 6 tablets in 24 hours.) 90 capsule 5   hydrochlorothiazide  (HYDRODIURIL ) 25 MG tablet TAKE 1 TABLET (25 MG TOTAL) BY MOUTH DAILY. 90 tablet 1   levothyroxine  (SYNTHROID ) 137 MCG tablet Take 1 tablet (137 mcg total) by mouth daily before breakfast. 90 tablet 1   fluconazole  (DIFLUCAN ) 150 MG tablet Take 1 tablet (150 mg total) by mouth every three (3) days as needed. (Patient not taking: Reported on 07/20/2024) 2 tablet 0   meloxicam (MOBIC) 15 MG tablet Take 15 mg by mouth daily as needed. (Patient not taking: Reported on 07/20/2024)     No facility-administered medications prior to visit.   Past Medical History:  Diagnosis Date   Graves  disease    Hypertension    Scleritis    Past Surgical History:  Procedure Laterality Date   ABDOMINAL HYSTERECTOMY     Allergies  Allergen Reactions   Morphine     Other reaction(s): Other (See Comments) Other Reaction: OTHER REACTION    Amlodipine  Itching   Benazepril Itching   Penicillins     Other reaction(s): Unknown   Cephalexin Rash      Objective:    Physical Exam Vitals and nursing note reviewed.  Constitutional:      Appearance: Normal appearance.  Cardiovascular:     Rate and Rhythm: Normal rate. Rhythm irregular. Occasional Extrasystoles are present. Pulmonary:     Effort: Pulmonary effort is normal.     Breath sounds: Normal breath sounds.  Musculoskeletal:        General: Normal range of motion.  Skin:    General: Skin is warm and dry.     Findings: Erythema (mild on tip of nose, approx. 1.5cm in diameter around lesion) and lesion (raised, swollen, smooth on top of nose, approx. 0.5cm in diameter, see pic) present.  Neurological:     Mental Status: She is alert.  Psychiatric:        Mood and Affect: Mood normal.        Behavior: Behavior normal.     BP 130/88  Pulse 78   Temp (!) 97.5 F (36.4 C) (Temporal)   Ht 5' 8 (1.727 m)   Wt 225 lb (102.1 kg)   SpO2 95%   BMI 34.21 kg/m  Wt Readings from Last 3 Encounters:  07/20/24 225 lb (102.1 kg)  05/12/24 215 lb 6.4 oz (97.7 kg)  03/10/24 237 lb (107.5 kg)   *I personally spent a total of 35 minutes in the care of the patient today including preparing to see the patient, getting/reviewing separately obtained history, performing a medically appropriate exam/evaluation, counseling and educating, placing orders, documenting clinical information in the EHR, and independently interpreting results.     Lucius Krabbe, NP

## 2024-07-20 NOTE — Telephone Encounter (Signed)
 FYI Only or Action Required?: FYI only for provider: appointment scheduled on 07/20/24.  Patient was last seen in primary care on 05/12/2024 by Kennyth Worth HERO, MD.  Called Nurse Triage reporting Palpitations  Symptoms began several weeks ago.  Interventions attempted: Rest, hydration, or home remedies.  Symptoms are: gradually worsening. Pt notes symptoms began following AM dose of Synthroid .  Triage Disposition: See HCP Within 4 Hours (Or PCP Triage)  Patient/caregiver understands and will follow disposition?: Yes    Copied from CRM [id number]. Topic: Clinical - Red Word Triage >> Jul 20, 2024  8:22 AM Rosina BIRCH wrote: Red Word that prompted transfer to Nurse Triage: irregular heart beat and she thinks It has something to do with her thyroids Reason for Disposition  Age > 60 years  (Exception: Brief heartbeat symptoms that went away and now feels well.)  Answer Assessment - Initial Assessment Questions Pt notes symptoms feel the same as previous times her thyroid  levels have been off, denies CP, SOB, dizziness, HA, vision changes, changes in speech, weakness, reflux.  BP 156/86 HR 82 0835 BP 148/93 HR 79 0836  1. DESCRIPTION: Please describe your heart rate or heartbeat that you are having (e.g., fast/slow, regular/irregular, skipped or extra beats, palpitations)     Heart feels like it's fluttering 2. ONSET: When did it start? (e.g., minutes, hours, days)      Around 10/28 3. DURATION: How long does it last (e.g., seconds, minutes, hours)     Began after pt took BP/thyroid  medications around 0600 this AM 4. PATTERN Does it come and go, or has it been constant since it started?  Does it get worse with exertion?   Are you feeling it now?     Constant but tapers off in the evening 7. RECURRENT SYMPTOM: Have you ever had this before? If Yes, ask: When was the last time? and What happened that time?      Yes 8. CAUSE: What do you think is causing the  palpitations?     Pt believes it is her thyroid  9. CARDIAC HISTORY: Do you have any history of heart disease? (e.g., heart attack, angina, bypass surgery, angioplasty, arrhythmia)      HTN 10. OTHER SYMPTOMS: Do you have any other symptoms? (e.g., dizziness, chest pain, sweating, difficulty breathing)       Fatigue  Protocols used: Heart Rate and Heartbeat Questions-A-AH

## 2024-07-22 ENCOUNTER — Telehealth: Payer: Self-pay | Admitting: *Deleted

## 2024-07-22 ENCOUNTER — Ambulatory Visit: Payer: Self-pay | Admitting: Family

## 2024-07-22 NOTE — Telephone Encounter (Signed)
 Copied from CRM #[id number]. Topic: Clinical - Prescription Issue >> Jul 22, 2024  1:32 PM Shereese L wrote: Reason for CRM: Patient stated on her visit 11/2 the doctor adv that she would lower her medication mg for levothyroxine  (SYNTHROID ) 137 MCG tablet to 125 MCG. No script has been sent for the dosage to [facility]

## 2024-07-22 NOTE — Telephone Encounter (Signed)
 From last AVS with [place]: Hypothyroidism  Current dose of 137 mcg levothyroxine  may be causing palpitations and elevated blood pressure, indicating possible dosage imbalance. - Advised taking levothyroxine  on an empty stomach, separate from other medications.  Will notify patient via MyChart of this. Please advise if she should try suggested treatment plan advised by [facility] or if she have a lower dose or change treatment plan is applicable.   Please and thank you!

## 2024-07-25 ENCOUNTER — Other Ambulatory Visit: Payer: Self-pay | Admitting: *Deleted

## 2024-07-25 DIAGNOSIS — E039 Hypothyroidism, unspecified: Secondary | ICD-10-CM

## 2024-07-25 MED ORDER — LEVOTHYROXINE SODIUM 125 MCG PO TABS
125.0000 ug | ORAL_TABLET | Freq: Every day | ORAL | 3 refills | Status: AC
Start: 2024-07-25 — End: ?

## 2024-07-25 NOTE — Telephone Encounter (Signed)
 She saw stephanie for this. Please send in a prescription for Synthroid  125 mcg daily and have her follow-up here for recheck in 4 to 6 weeks.

## 2024-07-25 NOTE — Telephone Encounter (Signed)
 Patient aware Rx was send in., F/u for labs in 4-6 weeks

## 2024-07-25 NOTE — Telephone Encounter (Signed)
 Rx send to pharmacy
